# Patient Record
Sex: Male | Born: 1985 | Race: Black or African American | Hispanic: Yes | Marital: Married | Smoking: Former smoker
Health system: Southern US, Community
[De-identification: ages and names within clinical notes are randomized; demographics above are authoritative.]

## PROBLEM LIST (undated history)

## (undated) DIAGNOSIS — F1290 Cannabis use, unspecified, uncomplicated: Secondary | ICD-10-CM

## (undated) DIAGNOSIS — K219 Gastro-esophageal reflux disease without esophagitis: Secondary | ICD-10-CM

## (undated) DIAGNOSIS — A6000 Herpesviral infection of urogenital system, unspecified: Secondary | ICD-10-CM

## (undated) DIAGNOSIS — F17200 Nicotine dependence, unspecified, uncomplicated: Secondary | ICD-10-CM

## (undated) HISTORY — DX: Cannabis use, unspecified, uncomplicated: F12.90

## (undated) HISTORY — DX: Nicotine dependence, unspecified, uncomplicated: F17.200

## (undated) HISTORY — DX: Gastro-esophageal reflux disease without esophagitis: K21.9

## (undated) HISTORY — DX: Herpesviral infection of urogenital system, unspecified: A60.00

---
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
## 2008-09-15 ENCOUNTER — Emergency Department (HOSPITAL_COMMUNITY): Admission: EM | Admit: 2008-09-15 | Discharge: 2008-09-15 | Payer: Self-pay | Admitting: Emergency Medicine

## 2008-10-03 ENCOUNTER — Encounter (INDEPENDENT_AMBULATORY_CARE_PROVIDER_SITE_OTHER): Payer: Self-pay | Admitting: Adult Health

## 2008-10-03 ENCOUNTER — Ambulatory Visit: Payer: Self-pay | Admitting: Family Medicine

## 2008-10-03 LAB — CONVERTED CEMR LAB
ALT: <8 U/L
AST: 13 U/L
Albumin: 4.7 g/dL
Alkaline Phosphatase: 69 U/L
BUN: 11 mg/dL
Basophils Absolute: 0 10*3/uL
Basophils Relative: 0 %
CO2: 25 meq/L
Calcium: 10.4 mg/dL
Chloride: 104 meq/L
Creatinine, Ser: 1.01 mg/dL
Eosinophils Absolute: 0.2 10*3/uL
Eosinophils Relative: 2 %
Glucose, Bld: 80 mg/dL
HCT: 45.1 %
Helicobacter Pylori Antibody-IgG: 1.9 — ABNORMAL HIGH
Hemoglobin: 15.3 g/dL
Lymphocytes Relative: 34 %
Lymphs Abs: 3.1 10*3/uL
MCHC: 33.9 g/dL
MCV: 87.9 fL
Monocytes Absolute: 0.8 10*3/uL
Monocytes Relative: 9 %
Neutro Abs: 5.2 10*3/uL
Neutrophils Relative %: 56 %
Platelets: 282 10*3/uL
Potassium: 4.5 meq/L
RBC: 5.13 M/uL
RDW: 12.3 %
Sodium: 141 meq/L
Total Bilirubin: 0.6 mg/dL
Total Protein: 7.2 g/dL
WBC: 9.3 10*3/uL

## 2008-10-09 ENCOUNTER — Ambulatory Visit: Payer: Self-pay | Admitting: *Deleted

## 2008-11-28 ENCOUNTER — Ambulatory Visit: Payer: Self-pay | Admitting: Family Medicine

## 2008-11-28 ENCOUNTER — Encounter (INDEPENDENT_AMBULATORY_CARE_PROVIDER_SITE_OTHER): Payer: Self-pay | Admitting: Adult Health

## 2008-11-28 LAB — CONVERTED CEMR LAB
Amphetamine Screen, Ur: NEGATIVE
Barbiturate Quant, Ur: NEGATIVE
Benzodiazepines.: NEGATIVE
Cocaine Metabolites: NEGATIVE
Marijuana Metabolite: POSITIVE — AB
Methadone: NEGATIVE
Opiate Screen, Urine: NEGATIVE
Phencyclidine (PCP): NEGATIVE

## 2008-11-30 ENCOUNTER — Ambulatory Visit (HOSPITAL_COMMUNITY): Admission: RE | Admit: 2008-11-30 | Discharge: 2008-11-30 | Payer: Self-pay | Admitting: Internal Medicine

## 2008-12-19 ENCOUNTER — Ambulatory Visit: Payer: Self-pay | Admitting: Internal Medicine

## 2009-02-13 ENCOUNTER — Emergency Department (HOSPITAL_COMMUNITY): Admission: EM | Admit: 2009-02-13 | Discharge: 2009-02-13 | Payer: Self-pay | Admitting: Emergency Medicine

## 2009-02-22 ENCOUNTER — Ambulatory Visit: Payer: Self-pay | Admitting: Internal Medicine

## 2009-04-30 ENCOUNTER — Ambulatory Visit: Payer: Self-pay | Admitting: Family Medicine

## 2009-04-30 ENCOUNTER — Encounter (INDEPENDENT_AMBULATORY_CARE_PROVIDER_SITE_OTHER): Payer: Self-pay | Admitting: Internal Medicine

## 2009-04-30 LAB — CONVERTED CEMR LAB: GC Probe Amp, Urine: NEGATIVE

## 2009-05-03 ENCOUNTER — Ambulatory Visit: Payer: Self-pay | Admitting: Internal Medicine

## 2009-05-13 ENCOUNTER — Ambulatory Visit: Payer: Self-pay | Admitting: Internal Medicine

## 2009-05-13 ENCOUNTER — Encounter (INDEPENDENT_AMBULATORY_CARE_PROVIDER_SITE_OTHER): Payer: Self-pay | Admitting: Adult Health

## 2009-05-13 LAB — CONVERTED CEMR LAB
Albumin: 5.2 g/dL (ref 3.5–5.2)
Alkaline Phosphatase: 79 units/L (ref 39–117)
Basophils Absolute: 0 10*3/uL (ref 0.0–0.1)
CO2: 23 meq/L (ref 19–32)
Calcium: 10.4 mg/dL (ref 8.4–10.5)
Cholesterol: 164 mg/dL (ref 0–200)
Eosinophils Relative: 2 % (ref 0–5)
HCT: 47.7 % (ref 39.0–52.0)
HDL: 56 mg/dL (ref >39)
Lymphocytes Relative: 41 % (ref 12–46)
MCHC: 36.1 g/dL — ABNORMAL HIGH (ref 30.0–36.0)
Neutrophils Relative %: 48 % (ref 43–77)
Platelets: 288 10*3/uL (ref 150–400)
Potassium: 4.1 meq/L (ref 3.5–5.3)
RBC: 5.53 M/uL (ref 4.22–5.81)
RDW: 12.1 % (ref 11.5–15.5)
Sodium: 138 meq/L (ref 135–145)
Total CHOL/HDL Ratio: 2.9
Total Protein: 8 g/dL (ref 6.0–8.3)
Vit D, 25-Hydroxy: 31 ng/mL (ref 30–89)

## 2009-05-22 ENCOUNTER — Ambulatory Visit (HOSPITAL_COMMUNITY): Admission: RE | Admit: 2009-05-22 | Discharge: 2009-05-22 | Payer: Self-pay | Admitting: Internal Medicine

## 2009-07-16 ENCOUNTER — Emergency Department (HOSPITAL_COMMUNITY): Admission: EM | Admit: 2009-07-16 | Discharge: 2009-07-16 | Payer: Self-pay | Admitting: Emergency Medicine

## 2009-07-19 ENCOUNTER — Encounter (INDEPENDENT_AMBULATORY_CARE_PROVIDER_SITE_OTHER): Payer: Self-pay | Admitting: Adult Health

## 2009-07-19 ENCOUNTER — Ambulatory Visit: Payer: Self-pay | Admitting: Family Medicine

## 2009-07-19 LAB — CONVERTED CEMR LAB
Basophils Relative: 0 % (ref 0–1)
Eosinophils Absolute: 0.1 10*3/uL (ref 0.0–0.7)
HCT: 46.2 % (ref 39.0–52.0)
Hemoglobin: 16 g/dL (ref 13.0–17.0)
Lymphocytes Relative: 31 % (ref 12–46)
MCV: 86 fL (ref 78.0–100.0)
Monocytes Absolute: 1 10*3/uL (ref 0.1–1.0)
Monocytes Relative: 10 % (ref 3–12)
Platelets: 299 10*3/uL (ref 150–400)
RDW: 12.2 % (ref 11.5–15.5)
WBC: 10.1 10*3/uL (ref 4.0–10.5)

## 2009-07-24 ENCOUNTER — Ambulatory Visit: Payer: Self-pay | Admitting: Internal Medicine

## 2010-01-02 ENCOUNTER — Emergency Department (HOSPITAL_COMMUNITY)
Admission: EM | Admit: 2010-01-02 | Discharge: 2010-01-02 | Payer: Self-pay | Source: Home / Self Care | Admitting: Emergency Medicine

## 2010-03-17 LAB — POCT I-STAT, CHEM 8
BUN: 20 mg/dL (ref 6–23)
Calcium, Ion: 1.19 mmol/L (ref 1.12–1.32)
Chloride: 105 meq/L (ref 96–112)
Creatinine, Ser: 1.2 mg/dL (ref 0.4–1.5)
Glucose, Bld: 83 mg/dL (ref 70–99)
HCT: 47 % (ref 39.0–52.0)
Hemoglobin: 16 g/dL (ref 13.0–17.0)
Potassium: 3.9 meq/L (ref 3.5–5.1)
Sodium: 140 meq/L (ref 135–145)
TCO2: 28 mmol/L (ref 0–100)

## 2010-03-23 LAB — POCT I-STAT, CHEM 8
HCT: 53 % — ABNORMAL HIGH (ref 39.0–52.0)
Sodium: 140 mEq/L (ref 135–145)

## 2010-06-23 ENCOUNTER — Emergency Department (HOSPITAL_COMMUNITY)
Admission: EM | Admit: 2010-06-23 | Discharge: 2010-06-23 | Disposition: A | Discharge status: Home / Self Care | Payer: Self-pay | Attending: Emergency Medicine | Admitting: Emergency Medicine

## 2010-06-23 DIAGNOSIS — A6000 Herpesviral infection of urogenital system, unspecified: Secondary | ICD-10-CM | POA: Insufficient documentation

## 2010-06-23 LAB — URINALYSIS, ROUTINE W REFLEX MICROSCOPIC
Hgb urine dipstick: NEGATIVE
Nitrite: NEGATIVE
Specific Gravity, Urine: 1.019 (ref 1.005–1.030)
pH: 6 (ref 5.0–8.0)

## 2010-06-23 LAB — URINE MICROSCOPIC-ADD ON

## 2010-06-24 LAB — URINE CULTURE
Culture  Setup Time: 201206182025
Culture: NO GROWTH

## 2010-12-06 ENCOUNTER — Encounter: Payer: Self-pay | Admitting: Emergency Medicine

## 2010-12-06 ENCOUNTER — Emergency Department (HOSPITAL_COMMUNITY)
Admission: EM | Admit: 2010-12-06 | Discharge: 2010-12-06 | Disposition: A | Discharge status: Home / Self Care | Payer: Self-pay | Attending: Emergency Medicine | Admitting: Emergency Medicine

## 2010-12-06 DIAGNOSIS — R0682 Tachypnea, not elsewhere classified: Secondary | ICD-10-CM | POA: Insufficient documentation

## 2010-12-06 DIAGNOSIS — R110 Nausea: Secondary | ICD-10-CM | POA: Insufficient documentation

## 2010-12-06 DIAGNOSIS — G43909 Migraine, unspecified, not intractable, without status migrainosus: Secondary | ICD-10-CM | POA: Insufficient documentation

## 2010-12-06 DIAGNOSIS — R079 Chest pain, unspecified: Secondary | ICD-10-CM | POA: Insufficient documentation

## 2010-12-06 DIAGNOSIS — H53149 Visual discomfort, unspecified: Secondary | ICD-10-CM | POA: Insufficient documentation

## 2010-12-06 DIAGNOSIS — K219 Gastro-esophageal reflux disease without esophagitis: Secondary | ICD-10-CM | POA: Insufficient documentation

## 2010-12-06 MED ORDER — GI COCKTAIL ~~LOC~~
30.0000 mL | Freq: Once | ORAL | Status: AC
  Administered 2010-12-06: 30 mL via ORAL
  Filled 2010-12-06: qty 30

## 2010-12-06 MED ORDER — DIPHENHYDRAMINE HCL 50 MG/ML IJ SOLN
25.0000 mg | Freq: Once | INTRAMUSCULAR | Status: AC
  Administered 2010-12-06: 20:00:00 via INTRAMUSCULAR
  Filled 2010-12-06: qty 1

## 2010-12-06 MED ORDER — METOCLOPRAMIDE HCL 5 MG/ML IJ SOLN
10.0000 mg | Freq: Once | INTRAMUSCULAR | Status: AC
  Administered 2010-12-06: 10 mg via INTRAMUSCULAR
  Filled 2010-12-06: qty 2

## 2010-12-06 NOTE — ED Provider Notes (Signed)
  Physical Exam  BP 132/72  Pulse 80  Temp(Src) 98.6 F (37 C) (Oral)  Resp 20  SpO2 99%  Physical Exam Migraine headache has been medicated and awaiting relief will reeval in 60 minutes  ED Course  Procedures Reports pain relief will DC per Dr. Trudi Ida instructions  MDM Migraine      Arman Filter, NP 12/06/10 0454  Arman Filter, NP 12/06/10 2055

## 2010-12-06 NOTE — ED Provider Notes (Signed)
History     CSN: [id number] Arrival date & time: 12/06/2010  3:43 PM   First MD Initiated Contact with Patient 12/06/10 1829      Chief Complaint  Patient presents with  . Migraine    (Consider location/radiation/quality/duration/timing/severity/associated sxs/prior treatment) Patient is a 25 y.o. male presenting with migraine. The history is provided by the patient.  Migraine This is a new problem. Episode onset: 3 days ago. The problem occurs constantly. The problem has not changed since onset.Associated symptoms include chest pain and headaches. Pertinent negatives include no abdominal pain and no shortness of breath. The symptoms are aggravated by stress (light, noise). The symptoms are relieved by nothing. He has tried acetaminophen and ASA for the symptoms. The treatment provided no relief.    History reviewed. No pertinent past medical history.  History reviewed. No pertinent past surgical history.  History reviewed. No pertinent family history.  History  Substance Use Topics  . Smoking status: Never Smoker   . Smokeless tobacco: Never Used  . Alcohol Use: Yes     Occasional      Review of Systems  Constitutional: Negative for fever.  Respiratory: Negative for shortness of breath.   Cardiovascular: Positive for chest pain.       Burning and fluxing in the chest  Gastrointestinal: Positive for nausea. Negative for vomiting and abdominal pain.  Neurological: Positive for headaches.    Allergies  Review of patient's allergies indicates no known allergies.  Home Medications  No current outpatient prescriptions on file.  BP 132/72  Pulse 80  Temp(Src) 98.6 F (37 C) (Oral)  Resp 20  SpO2 99%  Physical Exam  Nursing note and vitals reviewed. Constitutional: He is oriented to person, place, and time. He appears well-developed and well-nourished. No distress.  HENT:  Head: Normocephalic and atraumatic.  Right Ear: External ear normal.  Left Ear: External  ear normal.  Mouth/Throat: Oropharynx is clear and moist.  Eyes: Conjunctivae and EOM are normal. Pupils are equal, round, and reactive to light. Right eye exhibits no discharge. Left eye exhibits no discharge.       photophobia  Neck: Normal range of motion. Neck supple. No spinous process tenderness present. No rigidity. No Brudzinski's sign and no Kernig's sign noted.  Cardiovascular: Normal rate, regular rhythm, normal heart sounds and intact distal pulses.   No murmur heard. Pulmonary/Chest: Tachypnea noted. No respiratory distress. He has no decreased breath sounds. He has no wheezes. He has no rhonchi. He has no rales.  Abdominal: Soft. He exhibits no distension. There is no tenderness.  Musculoskeletal: Normal range of motion. He exhibits no edema and no tenderness.  Lymphadenopathy:    He has no cervical adenopathy.  Neurological: He is alert and oriented to person, place, and time. He has normal strength. No cranial nerve deficit or sensory deficit. Coordination and gait normal. GCS eye subscore is 4. GCS verbal subscore is 5. GCS motor subscore is 6.  Skin: Skin is warm and dry. No rash noted.  Psychiatric: He has a normal mood and affect. His behavior is normal.    ED Course  Procedures (including critical care time)  Labs Reviewed - No data to display No results found.   No diagnosis found.    MDM   Pt with typical migraine HA without sx suggestive of SAH(sudden onset, worst of life, or deficits), infection, or cavernous vein thrombosis.  Normal neuro exam and vital signs. Will give HA cocktail and on re-eval. Also c/o of  GERD like sx with burning up and down the sternum.  States he has reflux but not taking meds right now.  Will try a GI cocktail.        Gwyneth Sprout, MD 12/06/10 2002

## 2010-12-06 NOTE — ED Notes (Signed)
Pt reports headache x 4 days and chest pain x 3-4 days.

## 2010-12-06 NOTE — ED Provider Notes (Signed)
I performed all above hx and pe.  PA only involved in re-eval and d/c.  Gwyneth Sprout, MD 12/06/10 2146

## 2011-02-03 ENCOUNTER — Emergency Department (INDEPENDENT_AMBULATORY_CARE_PROVIDER_SITE_OTHER)
Admission: EM | Admit: 2011-02-03 | Discharge: 2011-02-03 | Disposition: A | Discharge status: Home / Self Care | Payer: Self-pay | Source: Home / Self Care | Attending: Family Medicine | Admitting: Family Medicine

## 2011-02-03 DIAGNOSIS — R0789 Other chest pain: Secondary | ICD-10-CM

## 2011-02-03 MED ORDER — IBUPROFEN 800 MG PO TABS: 800.0000 mg | ORAL_TABLET | Freq: Three times a day (TID) | ORAL | Status: AC

## 2011-02-03 NOTE — ED Notes (Signed)
C/o chest pain onset one week ago, sharp with palpation

## 2011-02-03 NOTE — ED Notes (Signed)
Denies nausea or vomiting, little sob.  No known injury

## 2011-02-03 NOTE — ED Provider Notes (Signed)
History     CSN: [id number]  Arrival date & time 02/03/11  1448   First MD Initiated Contact with Patient 02/03/11 1512      Chief Complaint  Patient presents with  . Chest Pain    (Consider location/radiation/quality/duration/timing/severity/associated sxs/prior treatment) Patient is a 26 y.o. male presenting with chest pain. The history is provided by the patient and a friend.  Chest Pain The chest pain began 1 - 2 weeks ago. Chest pain occurs intermittently. The chest pain is unchanged. The pain is associated with lifting. The severity of the pain is mild. The quality of the pain is described as sharp. The pain radiates to the left shoulder. Pertinent negatives for primary symptoms include no palpitations. Risk factors include no known risk factors.     No past medical history on file.  No past surgical history on file.  No family history on file.  History  Substance Use Topics  . Smoking status: Never Smoker   . Smokeless tobacco: Never Used  . Alcohol Use: Yes     Occasional      Review of Systems  Constitutional: Negative.   HENT: Negative.   Eyes: Negative.   Respiratory: Negative.   Cardiovascular: Positive for chest pain. Negative for palpitations.  Gastrointestinal: Negative.     Allergies  Review of patient's allergies indicates no known allergies.  Home Medications   Current Outpatient Rx  Name Route Sig Dispense Refill  . IBUPROFEN 800 MG PO TABS Oral Take 1 tablet (800 mg total) by mouth 3 (three) times daily. 30 tablet 0    BP 146/79  Pulse 117  Temp(Src) 99.6 F (37.6 C) (Oral)  Resp 16  SpO2 100%  Physical Exam  Nursing note and vitals reviewed. Constitutional: He is oriented to person, place, and time. He appears well-developed and well-nourished.  HENT:  Head: Normocephalic.  Mouth/Throat: Oropharynx is clear and moist.  Eyes: Pupils are equal, round, and reactive to light.  Neck: Normal range of motion. Neck supple.    Cardiovascular: Normal rate, normal heart sounds and intact distal pulses.   Pulmonary/Chest: Effort normal and breath sounds normal. He exhibits tenderness.  Lymphadenopathy:    He has no cervical adenopathy.  Neurological: He is alert and oriented to person, place, and time.  Skin: Skin is warm and dry.  Psychiatric: He has a normal mood and affect.    ED Course  Procedures (including critical care time)  Labs Reviewed - No data to display No results found.   1. Musculoskeletal chest pain       MDM          Barkley Bruns, MD 02/03/11 [phone]

## 2011-02-07 ENCOUNTER — Encounter (HOSPITAL_COMMUNITY): Payer: Self-pay | Admitting: Emergency Medicine

## 2011-05-23 IMAGING — CR DG KNEE COMPLETE 4+V*R*
4 series · 4 of 4 positions shown · non-contrast
Comparison: None

CLINICAL DATA: Twisting injury to right knee

RIGHT KNEE - COMPLETE 4+ VIEW

[t knee ap right]
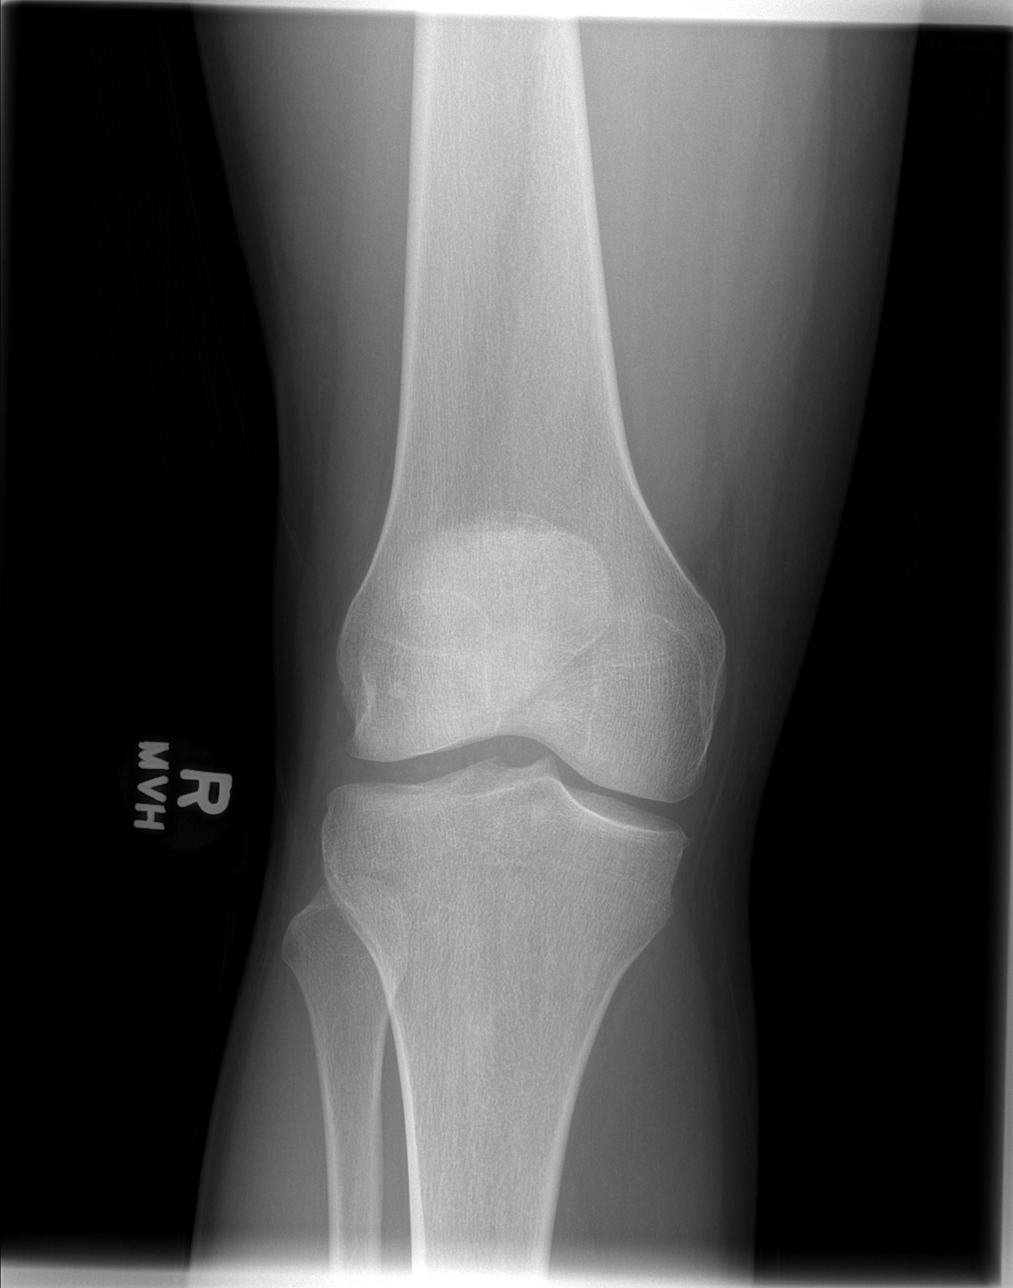

[t knee oblique right (1 of 2)]
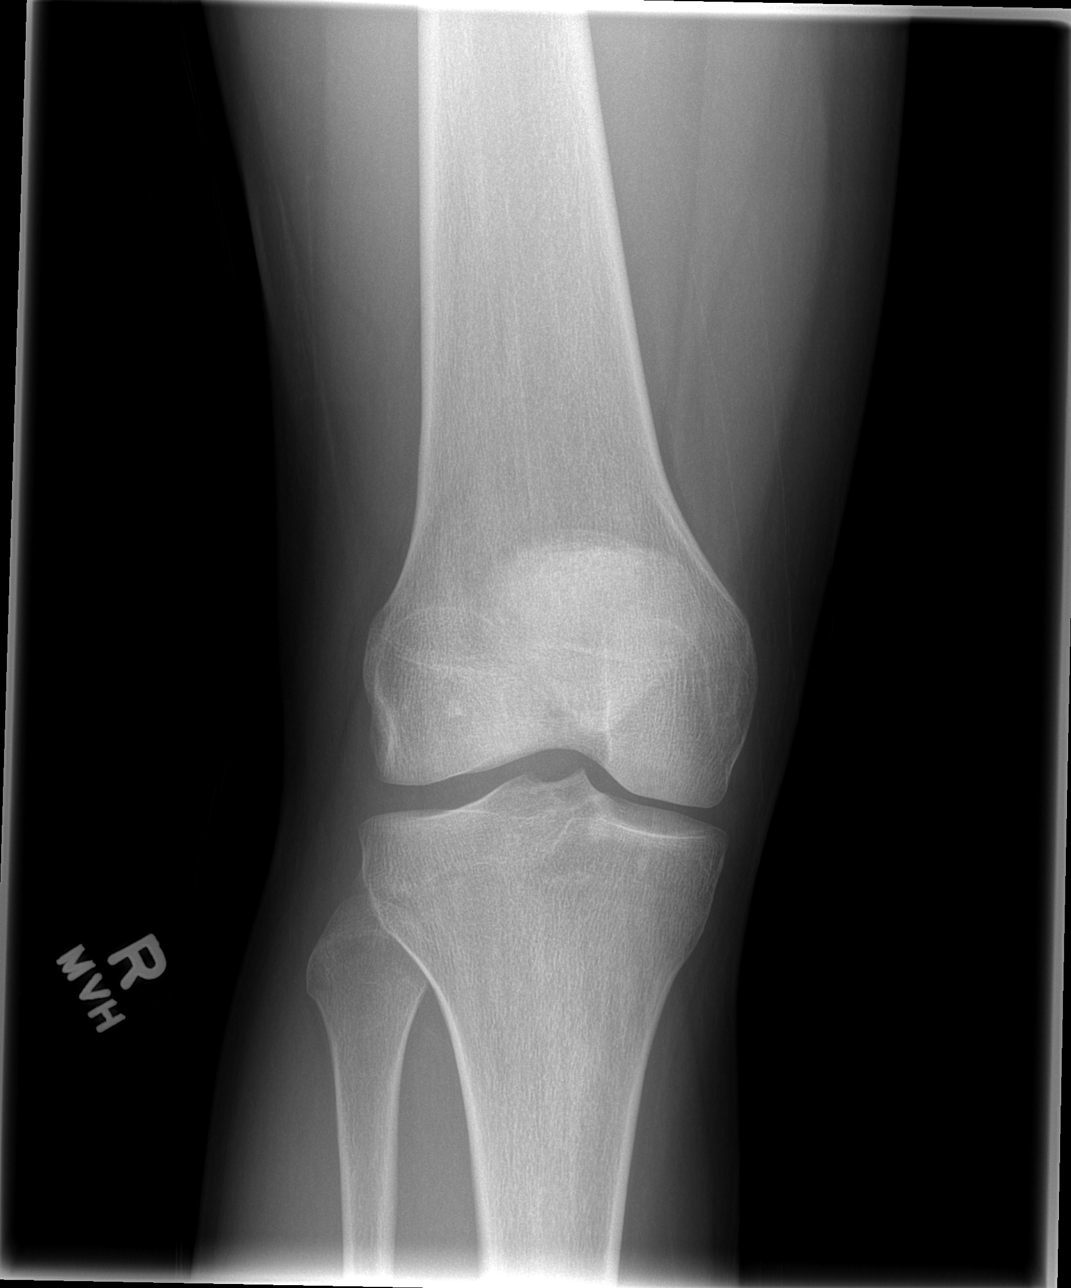

[t knee oblique right (2 of 2)]
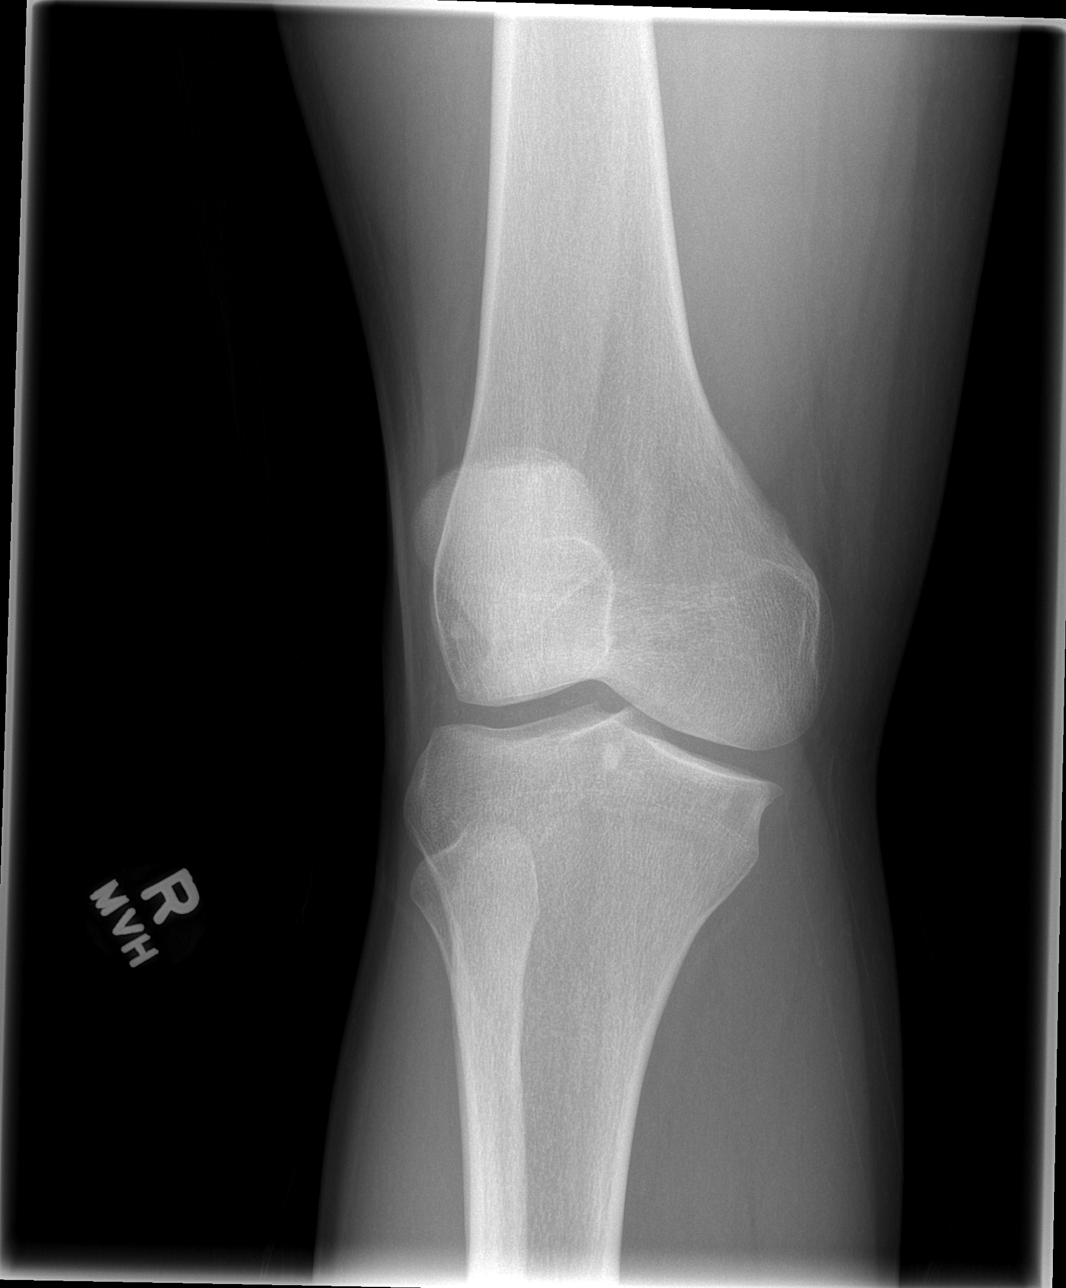

[t knee lat right]
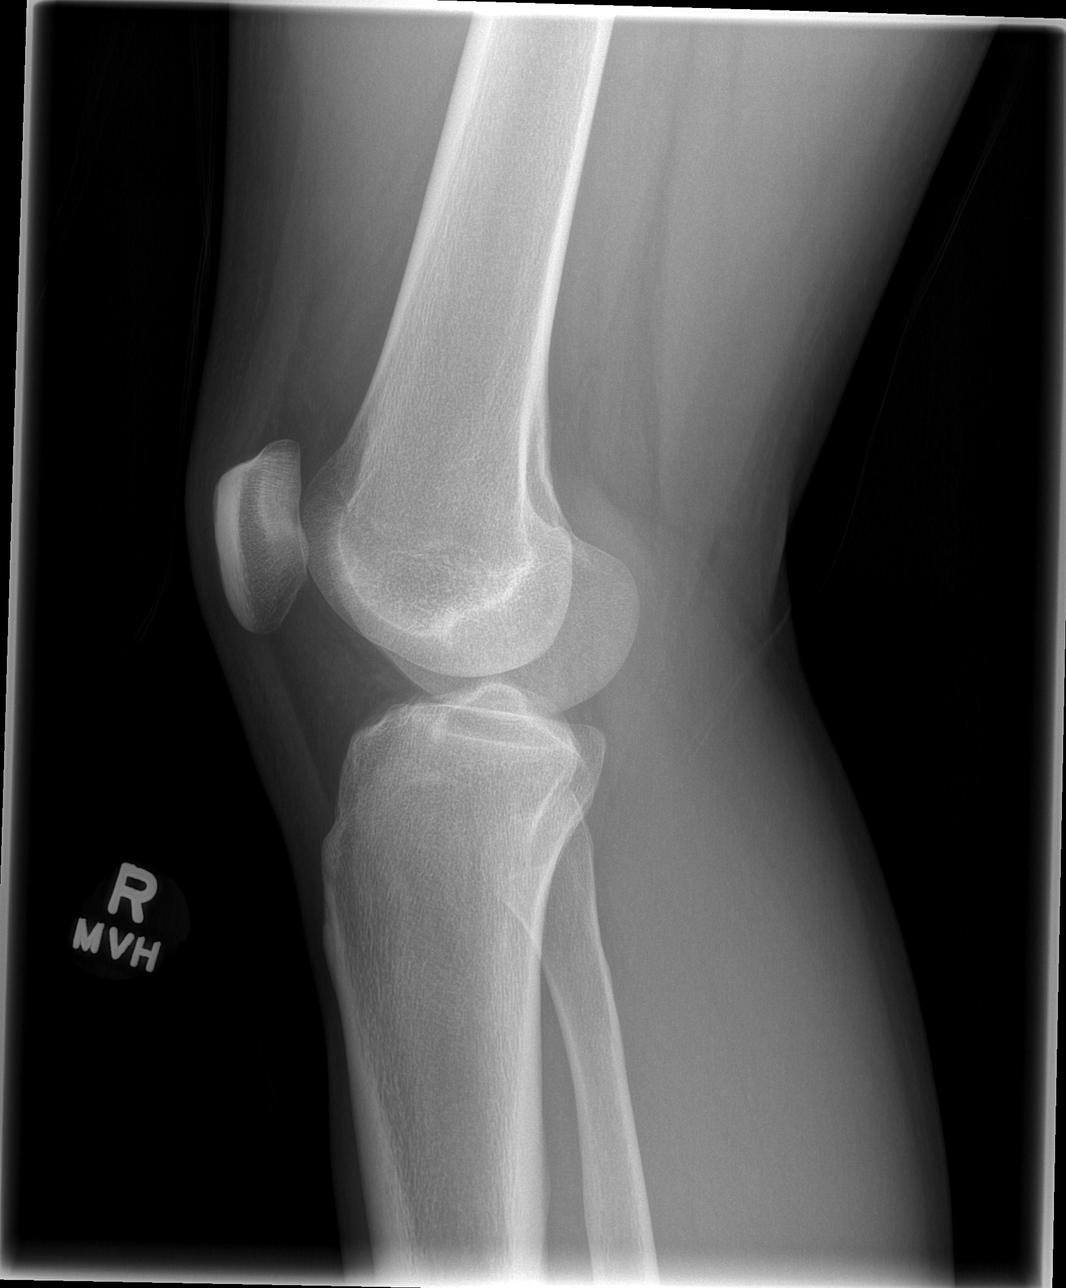

[4 of 4 positions shown; findings below may reference images not displayed]

FINDINGS: There is no evidence of fracture or dislocation.  There
is no evidence of arthropathy or other focal bone abnormality.
Soft tissues are unremarkable.
IMPRESSION: No acute findings.

## 2012-03-16 ENCOUNTER — Encounter (HOSPITAL_COMMUNITY): Payer: Self-pay | Admitting: *Deleted

## 2012-03-16 ENCOUNTER — Emergency Department (HOSPITAL_COMMUNITY)
Admission: EM | Admit: 2012-03-16 | Discharge: 2012-03-16 | Disposition: A | Discharge status: Home / Self Care | Payer: No Typology Code available for payment source | Attending: Emergency Medicine | Admitting: Emergency Medicine

## 2012-03-16 DIAGNOSIS — R51 Headache: Secondary | ICD-10-CM | POA: Insufficient documentation

## 2012-03-16 DIAGNOSIS — Z862 Personal history of diseases of the blood and blood-forming organs and certain disorders involving the immune mechanism: Secondary | ICD-10-CM | POA: Insufficient documentation

## 2012-03-16 DIAGNOSIS — R42 Dizziness and giddiness: Secondary | ICD-10-CM

## 2012-03-16 DIAGNOSIS — M542 Cervicalgia: Secondary | ICD-10-CM | POA: Insufficient documentation

## 2012-03-16 DIAGNOSIS — Z8639 Personal history of other endocrine, nutritional and metabolic disease: Secondary | ICD-10-CM | POA: Insufficient documentation

## 2012-03-16 LAB — CBC WITH DIFFERENTIAL/PLATELET
Basophils Absolute: 0 10*3/uL (ref 0.0–0.1)
Eosinophils Absolute: 0.1 10*3/uL (ref 0.0–0.7)
Lymphocytes Relative: 27 % (ref 12–46)
MCV: 84.1 fL (ref 78.0–100.0)
Platelets: 257 10*3/uL (ref 150–400)
RDW: 11.9 % (ref 11.5–15.5)
WBC: 11.2 10*3/uL — ABNORMAL HIGH (ref 4.0–10.5)

## 2012-03-16 LAB — BASIC METABOLIC PANEL
BUN: 8 mg/dL (ref 6–23)
Chloride: 103 mEq/L (ref 96–112)
GFR calc Af Amer: >90 mL/min (ref >90)
Potassium: 3.9 mEq/L (ref 3.5–5.1)

## 2012-03-16 MED ORDER — MECLIZINE HCL 25 MG PO TABS: 25.0000 mg | ORAL_TABLET | Freq: Three times a day (TID) | ORAL | Status: DC | PRN

## 2012-03-16 MED ORDER — SODIUM CHLORIDE 0.9 % IV BOLUS (SEPSIS)
500.0000 mL | Freq: Once | INTRAVENOUS | Status: AC
  Administered 2012-03-16: 500 mL via INTRAVENOUS

## 2012-03-16 NOTE — ED Provider Notes (Signed)
History     CSN: [id number]  Arrival date & time 03/16/12  1208   First MD Initiated Contact with Patient 03/16/12 1523      Chief Complaint  Patient presents with  . Dizziness    (Consider location/radiation/quality/duration/timing/severity/associated sxs/prior treatment) HPI Comments: Patient comes to the emergency department for evaluation of dizziness. Patient reports that he has had symptoms for 3 or 4 days. Patient reports that he gets very lightheaded and at times when he stands up he feels like he is spinning. If he sits and rest, symptoms get better. He has had persistent recurrent symptoms with changes in position, however. Patient reports that he felt similarly a year ago when he got dehydrated. He has not had any nausea, vomiting or diarrhea. He denies nasal congestion, fever, and neck stiffness.   History reviewed. No pertinent past medical history.  History reviewed. No pertinent past surgical history.  History reviewed. No pertinent family history.  History  Substance Use Topics  . Smoking status: Never Smoker   . Smokeless tobacco: Never Used  . Alcohol Use: Yes     Comment: Occasional      Review of Systems  Constitutional: Negative for fever.  HENT: Positive for neck pain. Negative for hearing loss, ear pain, neck stiffness and tinnitus.   Respiratory: Negative for cough.   Neurological: Positive for dizziness, light-headedness and headaches.  All other systems reviewed and are negative.    Allergies  Review of patient's allergies indicates no known allergies.  Home Medications  No current outpatient prescriptions on file.  BP 165/87  Pulse 75  Temp(Src) 98.1 F (36.7 C)  Resp 18  SpO2 100%  Physical Exam  Constitutional: He is oriented to person, place, and time. He appears well-developed and well-nourished. No distress.  HENT:  Head: Normocephalic and atraumatic.  Right Ear: Hearing, tympanic membrane and ear canal normal.  Left Ear:  Tympanic membrane and ear canal normal.  Nose: Nose normal.  Mouth/Throat: Oropharynx is clear and moist and mucous membranes are normal.  Eyes: Conjunctivae and EOM are normal. Pupils are equal, round, and reactive to light.  Neck: Normal range of motion. Neck supple.  Cardiovascular: Normal rate, regular rhythm, S1 normal and S2 normal.  Exam reveals no gallop and no friction rub.   No murmur heard. Pulmonary/Chest: Effort normal and breath sounds normal. No respiratory distress. He exhibits no tenderness.  Abdominal: Soft. Normal appearance and bowel sounds are normal. There is no hepatosplenomegaly. There is no tenderness. There is no rebound, no guarding, no tenderness at McBurney's point and negative Murphy's sign. No hernia.  Musculoskeletal: Normal range of motion.  Neurological: He is alert and oriented to person, place, and time. He has normal strength. No cranial nerve deficit or sensory deficit. Coordination normal. GCS eye subscore is 4. GCS verbal subscore is 5. GCS motor subscore is 6.  Skin: Skin is warm, dry and intact. No rash noted. No cyanosis.  Psychiatric: He has a normal mood and affect. His speech is normal and behavior is normal. Thought content normal.    ED Course  Procedures (including critical care time)  Labs Reviewed  CBC WITH DIFFERENTIAL - Abnormal; Notable for the following:    WBC 11.2 (*)    MCHC 36.2 (*)    All other components within normal limits  BASIC METABOLIC PANEL   No results found.   Diagnosis: Vertigo    MDM  She presents to the ER with 3 days of intermittent dizziness and  vertiginous symptoms. He has had some mild headache as well. All symptoms come and go. This is not the worse headache of his life or a thunderclap type onset. Patient has a normal neurologic examination. No suspicion for subarachnoid hemorrhage. He has not had any trauma. Description of symptoms is most consistent with a peripheral vertigo.Medical screening  examination/treatment/procedure(s) were performed by non-physician practitioner and as supervising physician I was immediately available for consultation/collaboration. Work unremarkable. Patient administered IV hydration will be treated symptomatically. Return if symptoms worsen.        Gilda Crease, MD 03/16/12 1731

## 2012-03-16 NOTE — ED Notes (Signed)
Reports being lightheaded, dizzy and weak x 3-4 days.  States that he has also had a HA.  Pt denies taking medication PTA, denies N/V/D.  PERRLA.  States that he smoked marijuana 2 days ago.  Pt A/O x 4, ambulatory without difficulty.

## 2012-04-07 ENCOUNTER — Emergency Department (INDEPENDENT_AMBULATORY_CARE_PROVIDER_SITE_OTHER)
Admission: EM | Admit: 2012-04-07 | Discharge: 2012-04-07 | Disposition: A | Discharge status: Home / Self Care | Payer: No Typology Code available for payment source | Source: Home / Self Care

## 2012-04-07 ENCOUNTER — Encounter (HOSPITAL_COMMUNITY): Payer: Self-pay

## 2012-04-07 DIAGNOSIS — K029 Dental caries, unspecified: Secondary | ICD-10-CM

## 2012-04-07 NOTE — ED Notes (Signed)
Patient here for dental referral

## 2012-04-07 NOTE — ED Notes (Signed)
Patient Demographics  Randall Rodriguez, is a 27 y.o. male  OZH:[id number]  GEX:[id number]  DOB - 1985/02/26  Chief Complaint  Patient presents with  . Dental Pain        Subjective:   Randall Rodriguez no medical issues is here to get a dentist referral for her dental cavity. No pain tenderness at the site, no fever chills or discharge, no jaw swelling.  Objective:   History reviewed. No pertinent past medical history.    History reviewed. No pertinent past surgical history.   Filed Vitals:   04/07/12 1310  BP: 130/64  Pulse: 68  Temp: 98 F (36.7 C)  TempSrc: Oral  SpO2: 99%     Exam  Awake Alert, Oriented X 3, No new F.N deficits, Normal affect [place].AT,PERRAL, Mall cavity noted in the left lower mandibular tooth no signs of returning infection or abscess no tenderness. Supple Neck,No JVD, No cervical lymphadenopathy appriciated.  Symmetrical Chest wall movement, Good air movement bilaterally, CTAB RRR,No Gallops,Rubs or new Murmurs, No Parasternal Heave +ve B.Sounds, Abd Soft, Non tender, No organomegaly appriciated, No rebound - guarding or rigidity. No Cyanosis, Clubbing or edema, No new Rash or bruise      Data Review   CBC No results found for this basename: WBC, HGB, HCT, PLT, MCV, MCH, MCHC, RDW, NEUTRABS, LYMPHSABS, MONOABS, EOSABS, BASOSABS, BANDABS, BANDSABD,  in the last 168 hours  Chemistries   No results found for this basename: NA, K, CL, CO2, GLUCOSE, BUN, CREATININE, GFRCGP, CALCIUM, MG, AST, ALT, ALKPHOS, BILITOT,  in the last 168 hours ------------------------------------------------------------------------------------------------------------------ No results found for this basename: HGBA1C,  in the last 72 hours ------------------------------------------------------------------------------------------------------------------ No results found for this basename: CHOL, HDL, LDLCALC, TRIG, CHOLHDL, LDLDIRECT,  in the last 72  hours ------------------------------------------------------------------------------------------------------------------ No results found for this basename: TSH, T4TOTAL, FREET3, T3FREE, THYROIDAB,  in the last 72 hours ------------------------------------------------------------------------------------------------------------------ No results found for this basename: VITAMINB12, FOLATE, FERRITIN, TIBC, IRON, RETICCTPCT,  in the last 72 hours  Coagulation profile  No results found for this basename: INR, PROTIME,  in the last 168 hours     Prior to Admission medications   Medication Sig Start Date End Date Taking? Authorizing Provider  meclizine (ANTIVERT) 25 MG tablet Take 1 tablet (25 mg total) by mouth 3 (three) times daily as needed. 03/16/12   Gilda Crease, MD     Assessment & Plan   Patient here for left mandibular last molar cavity. He wanted to get a referral to a dentist which was provided, does not have any pain swelling tenderness, no fever or chills. He does smoke and has been counseled to quit smoking.    Follow-up Information   Schedule an appointment as soon as possible for a visit to follow up. (As needed)        Leroy Sea M.D on 04/07/2012 at 1:19 PM   Leroy Sea, MD 04/07/12 1323

## 2012-04-11 NOTE — ED Notes (Signed)
Referral faxed to [facility]-waiting for an appt 

## 2012-07-20 ENCOUNTER — Ambulatory Visit: Payer: No Typology Code available for payment source

## 2012-07-27 ENCOUNTER — Ambulatory Visit: Payer: No Typology Code available for payment source | Attending: Family Medicine | Admitting: Family Medicine

## 2012-07-27 VITALS — BP 118/80 | HR 66 | Temp 98.4°F | Resp 14 | Ht 69.5 in | Wt 223.0 lb

## 2012-07-27 DIAGNOSIS — R071 Chest pain on breathing: Secondary | ICD-10-CM

## 2012-07-27 DIAGNOSIS — G8929 Other chronic pain: Secondary | ICD-10-CM | POA: Insufficient documentation

## 2012-07-27 DIAGNOSIS — F1290 Cannabis use, unspecified, uncomplicated: Secondary | ICD-10-CM

## 2012-07-27 DIAGNOSIS — A6000 Herpesviral infection of urogenital system, unspecified: Secondary | ICD-10-CM | POA: Insufficient documentation

## 2012-07-27 DIAGNOSIS — F1210 Cannabis abuse, uncomplicated: Secondary | ICD-10-CM | POA: Insufficient documentation

## 2012-07-27 DIAGNOSIS — K219 Gastro-esophageal reflux disease without esophagitis: Secondary | ICD-10-CM | POA: Insufficient documentation

## 2012-07-27 DIAGNOSIS — Z0000 Encounter for general adult medical examination without abnormal findings: Secondary | ICD-10-CM

## 2012-07-27 DIAGNOSIS — R0789 Other chest pain: Secondary | ICD-10-CM | POA: Insufficient documentation

## 2012-07-27 MED ORDER — ESOMEPRAZOLE MAGNESIUM 40 MG PO CPDR: 40.0000 mg | DELAYED_RELEASE_CAPSULE | Freq: Every day | ORAL | Status: DC

## 2012-07-27 NOTE — Patient Instructions (Signed)
Heartburn Heartburn is a painful, burning sensation in the chest. It may feel worse in certain positions, such as lying down or bending over. It is caused by stomach acid backing up into the tube that carries food from the mouth down to the stomach (lower esophagus).  CAUSES   Large meals.  Certain foods and drinks.  Exercise.  Increased acid production.  Being overweight or obese.  Certain medicines. SYMPTOMS   Burning pain in the chest or lower throat.  Bitter taste in the mouth.  Coughing. DIAGNOSIS  If the usual treatments for heartburn do not improve your symptoms, then tests may be done to see if there is another condition present. Possible tests may include:  X-rays.  Endoscopy. This is when a tube with a light and a camera on the end is used to examine the esophagus and the stomach.  A test to measure the amount of acid in the esophagus (pH test).  A test to see if the esophagus is working properly (esophageal manometry).  Blood, breath, or stool tests to check for bacteria that cause ulcers. TREATMENT   Your caregiver may tell you to use certain over-the-counter medicines (antacids, acid reducers) for mild heartburn.  Your caregiver may prescribe medicines to decrease the acid in your stomach or protect your stomach lining.  Your caregiver may recommend certain diet changes.  For severe cases, your caregiver may recommend that the head of your bed be elevated on blocks. (Sleeping with more pillows is not an effective treatment as it only changes the position of your head and does not improve the main problem of stomach acid refluxing into the esophagus.) HOME CARE INSTRUCTIONS   Take all medicines as directed by your caregiver.  Raise the head of your bed by putting blocks under the legs if instructed to by your caregiver.  Do not exercise right after eating.  Avoid eating 2 or 3 hours before bed. Do not lie down right after eating.  Eat small meals  throughout the day instead of 3 large meals.  Stop smoking if you smoke.  Maintain a healthy weight.  Identify foods and beverages that make your symptoms worse and avoid them. Foods you may want to avoid include:  Peppers.  Chocolate.  High-fat foods, including fried foods.  Spicy foods.  Garlic and onions.  Citrus fruits, including oranges, grapefruit, lemons, and limes.  Food containing tomatoes or tomato products.  Mint.  Carbonated drinks, caffeinated drinks, and alcohol.  Vinegar. SEEK IMMEDIATE MEDICAL CARE IF:  You have severe chest pain that goes down your arm or into your jaw or neck.  You feel sweaty, dizzy, or lightheaded.  You are short of breath.  You vomit blood.  You have difficulty or pain with swallowing.  You have bloody or black, tarry stools.  You have episodes of heartburn more than 3 times a week for more than 2 weeks. MAKE SURE YOU:  Understand these instructions.  Will watch your condition.  Will get help right away if you are not doing well or get worse. Document Released: 05/10/2008 Document Revised: 03/16/2011 Document Reviewed: 06/08/2010 [employer] Patient Information 2014 [employer]. Diet for Gastroesophageal Reflux Disease, Adult Reflux (acid reflux) is when acid from your stomach flows up into the esophagus. When acid comes in contact with the esophagus, the acid causes irritation and soreness (inflammation) in the esophagus. When reflux happens often or so severely that it causes damage to the esophagus, it is called gastroesophageal reflux disease (GERD). Nutrition   therapy can help ease the discomfort of GERD. FOODS OR DRINKS TO AVOID OR LIMIT  Smoking or chewing tobacco. Nicotine is one of the most potent stimulants to acid production in the gastrointestinal tract.  Caffeinated and decaffeinated coffee and black tea.  Regular or low-calorie carbonated beverages or energy drinks (caffeine-free carbonated beverages are  allowed).   Strong spices, such as black pepper, white pepper, red pepper, cayenne, curry powder, and chili powder.  Peppermint or spearmint.  Chocolate.  High-fat foods, including meats and fried foods. Extra added fats including oils, butter, salad dressings, and nuts. Limit these to less than 8 tsp per day.  Fruits and vegetables if they are not tolerated, such as citrus fruits or tomatoes.  Alcohol.  Any food that seems to aggravate your condition. If you have questions regarding your diet, call your caregiver or a registered dietitian. OTHER THINGS THAT MAY HELP GERD INCLUDE:   Eating your meals slowly, in a relaxed setting.  Eating 5 to 6 small meals per day instead of 3 large meals.  Eliminating food for a period of time if it causes distress.  Not lying down until 3 hours after eating a meal.  Keeping the head of your bed raised 6 to 9 inches (15 to 23 cm) by using a foam wedge or blocks under the legs of the bed. Lying flat may make symptoms worse.  Being physically active. Weight loss may be helpful in reducing reflux in overweight or obese adults.  Wear loose fitting clothing EXAMPLE MEAL PLAN This meal plan is approximately 2,000 calories based on https://www.[place].org/ChooseMyPlate.gov meal planning guidelines. Breakfast   cup cooked oatmeal.  1 cup strawberries.  1 cup low-fat milk.  1 oz almonds. Snack  1 cup cucumber slices.  6 oz yogurt (made from low-fat or fat-free milk). Lunch  2 slice whole-wheat bread.  2 oz sliced [place].  2 tsp mayonnaise.  1 cup blueberries.  1 cup snap peas. Snack  6 whole-wheat crackers.  1 oz string cheese. Dinner   cup brown rice.  1 cup mixed veggies.  1 tsp olive oil.  3 oz grilled fish. Document Released: 12/22/2004 Document Revised: 03/16/2011 Document Reviewed: 11/07/2010 [facility] Patient Information 2014 [place], [place].

## 2012-07-27 NOTE — Progress Notes (Signed)
Patient states he is here for annual physical.

## 2012-07-27 NOTE — Progress Notes (Signed)
CC:  New patient physical   HPI: Pt is presenting to have a physical done today.  He says that he has a problem with left side pain that has been present for  Years.  He has had xrays done and reports that it persists.  It is 8/10 and reports that he has untreated GERD.  Pt says that he has genital herpes and reports that he takes Valtrex as needed for outbreaks.  Pt denies nausea and vomiting.  He reports that certain foods don't seem to cause more pain.  He says that coughing and taking a deep breath does not cause more pain.    No Known Allergies Past Medical History  Diagnosis Date  . GERD (gastroesophageal reflux disease)   . Smoker   . Genital herpes   . Marijuana smoker    Current Outpatient Prescriptions on File Prior to Visit  Medication Sig Dispense Refill  . meclizine (ANTIVERT) 25 MG tablet Take 1 tablet (25 mg total) by mouth 3 (three) times daily as needed.  30 tablet  0   No current facility-administered medications on file prior to visit.   Family History  Problem Relation Age of Onset  . Hypertension Father   . Diabetes Father    History   Social History  . Marital Status: Single    Spouse Name: N/A    Number of Children: 0  . Years of Education: 11    Occupational History  . construction [employer]   Social History Main Topics  . Smoking status: Former Games developer  . Smokeless tobacco: Never Used  . Alcohol Use: Yes     Comment: Occasional  . Drug Use: Yes    Special: Marijuana  . Sexually Active: Yes   Other Topics Concern  . Not on file   Social History Narrative  . No narrative on file    Review of Systems  Constitutional: Negative for fever, chills, diaphoresis, activity change, appetite change and fatigue.  HENT: Negative for ear pain, nosebleeds, congestion, facial swelling, rhinorrhea, neck pain, neck stiffness and ear discharge.   Eyes: Negative for pain, discharge, redness, itching and visual disturbance.  Respiratory: Negative for cough,  choking, chest tightness, shortness of breath, wheezing and stridor.   Cardiovascular: left sided chest wall pain, Negative for palpitations and leg swelling.  Gastrointestinal: Negative for abdominal distention.  Genitourinary: Negative for dysuria, urgency, frequency, hematuria, flank pain, decreased urine volume, difficulty urinating and dyspareunia.  Musculoskeletal: left side pain and discomfort for several years. Negative for back pain, joint swelling, arthralgias and gait problem.  Neurological: Negative for dizziness, tremors, seizures, syncope, facial asymmetry, speech difficulty, weakness, light-headedness, numbness and headaches.  Hematological: Negative for adenopathy. Does not bruise/bleed easily.  Psychiatric/Behavioral: Negative for hallucinations, behavioral problems, confusion, dysphoric mood, decreased concentration and agitation.    Objective:   Filed Vitals:   07/27/12 0942  BP: 118/80  Pulse: 66  Temp: 98.4 F (36.9 C)  Resp: 14    Physical Exam  Constitutional: Appears well-developed and well-nourished. No distress.  HENT: Normocephalic. External right and left ear normal. Oropharynx is clear and moist.  Eyes: Conjunctivae and EOM are normal. PERRLA, no scleral icterus.  Neck: Normal ROM. Neck supple. No JVD. No tracheal deviation. No thyromegaly.  CVS: RRR, S1/S2 +, no murmurs, no gallops, no carotid bruit.  Pulmonary: Effort and breath sounds normal, no stridor, rhonchi, wheezes, rales.  Abdominal: Soft. BS +,  no distension, tenderness, rebound or guarding.  Mild epigastric TTP. Chest:  left sided chest wall pain not reproduced with palpation of chest wall, no masses seen.  Musculoskeletal: Normal range of motion. No edema and no tenderness.  Lymphadenopathy: No lymphadenopathy noted, cervical, inguinal. Neuro: Alert. Normal reflexes, muscle tone coordination. No cranial nerve deficit. Skin: Skin is warm and dry. No rash noted. Not diaphoretic. No erythema. No  pallor.  Psychiatric: Normal mood and affect. Behavior, judgment, thought content normal.   Lab Results  Component Value Date   WBC 11.2* 03/16/2012   HGB 16.1 03/16/2012   HCT 44.5 03/16/2012   MCV 84.1 03/16/2012   PLT 257 03/16/2012   Lab Results  Component Value Date   CREATININE 1.12 03/16/2012   BUN 8 03/16/2012   NA 140 03/16/2012   K 3.9 03/16/2012   CL 103 03/16/2012   CO2 26 03/16/2012    Lab Results  Component Value Date   HGBA1C 5.3 05/13/2009   Lipid Panel     Component Value Date/Time   CHOL 164 05/13/2009 2301   TRIG 62 05/13/2009 2301   HDL 56 05/13/2009 2301   CHOLHDL 2.9 Ratio 05/13/2009 2301   VLDL 12 05/13/2009 2301   LDLCALC 96 05/13/2009 2301       Assessment and plan:   Genital Herpes - pt to use valtrex as needed for outbreaks   Marijuana Smoker - pt is encouraged in cessation of using recreational substances. The patient verbalized understanding.    GERD untreated - Start omeprazole 20 mg  Po daily  Chronic left side chest wall pain  Order for CT chest to evaluate pain  Check labs today  Refill Valtrex.  RTC in 3 months  The patient was given clear instructions to go to ER or return to medical center if symptoms don't improve, worsen or new problems develop.  The patient verbalized understanding.  The patient was told to call to get any lab results if not heard anything in the next week.    Rodney Langton, MD, CDE, FAAFP [facility] [place], [place]

## 2012-07-28 LAB — COMPLETE METABOLIC PANEL WITH GFR
Alkaline Phosphatase: 78 U/L (ref 39–117)
BUN: 10 mg/dL (ref 6–23)
Creat: 1.04 mg/dL (ref 0.50–1.35)
GFR, Est Non African American: >89 mL/min
Glucose, Bld: 89 mg/dL (ref 70–99)
Total Bilirubin: 0.6 mg/dL (ref 0.3–1.2)

## 2012-07-28 LAB — LIPID PANEL
Cholesterol: 185 mg/dL (ref 0–200)
HDL: 52 mg/dL (ref >39)
LDL Cholesterol: 119 mg/dL — ABNORMAL HIGH (ref 0–99)
Total CHOL/HDL Ratio: 3.6 Ratio
Triglycerides: 72 mg/dL (ref <150)
VLDL: 14 mg/dL (ref 0–40)

## 2012-07-28 LAB — CBC
HCT: 42.9 % (ref 39.0–52.0)
MCV: 86 fL (ref 78.0–100.0)
Platelets: 307 10*3/uL (ref 150–400)
RBC: 4.99 MIL/uL (ref 4.22–5.81)
WBC: 7.7 10*3/uL (ref 4.0–10.5)

## 2012-07-28 NOTE — Progress Notes (Signed)
Quick Note:  Please inform patient that all labs came back okay.  Rodney Langton, MD, CDE, FAAFP [facility] [place], [place]   ______

## 2012-07-29 ENCOUNTER — Ambulatory Visit (HOSPITAL_COMMUNITY)
Admission: RE | Admit: 2012-07-29 | Discharge: 2012-07-29 | Disposition: A | Discharge status: Home / Self Care | Payer: No Typology Code available for payment source | Source: Ambulatory Visit | Attending: Family Medicine | Admitting: Family Medicine

## 2012-07-29 ENCOUNTER — Telehealth: Payer: Self-pay | Admitting: Family Medicine

## 2012-07-29 DIAGNOSIS — R071 Chest pain on breathing: Secondary | ICD-10-CM | POA: Insufficient documentation

## 2012-07-29 DIAGNOSIS — F17200 Nicotine dependence, unspecified, uncomplicated: Secondary | ICD-10-CM | POA: Insufficient documentation

## 2012-07-29 DIAGNOSIS — K219 Gastro-esophageal reflux disease without esophagitis: Secondary | ICD-10-CM | POA: Insufficient documentation

## 2012-07-29 DIAGNOSIS — R0789 Other chest pain: Secondary | ICD-10-CM

## 2012-07-29 MED ORDER — IOHEXOL 300 MG/ML  SOLN
80.0000 mL | Freq: Once | INTRAMUSCULAR | Status: AC | PRN
  Administered 2012-07-29: 80 mL via INTRAVENOUS

## 2012-07-29 NOTE — Progress Notes (Signed)
Quick Note:  Please inform patient that CT chest results were : Normal chest CT. No explanation for left-sided chest pain. Please follow up in next 3 months as scheduled or return sooner if symptoms not better.   Rodney Langton, MD, CDE, FAAFP [facility] [place], [place]    ______

## 2012-08-01 ENCOUNTER — Telehealth: Payer: Self-pay | Admitting: *Deleted

## 2012-08-01 NOTE — Telephone Encounter (Signed)
08/01/12 Patient made aware of CT chest results normal .Please follow up in 3 months. P.Posey Jasmin,RN BSN [facility]

## 2012-10-27 ENCOUNTER — Ambulatory Visit: Payer: Self-pay

## 2012-11-03 ENCOUNTER — Encounter (HOSPITAL_COMMUNITY): Payer: Self-pay | Admitting: Emergency Medicine

## 2012-11-03 ENCOUNTER — Emergency Department (HOSPITAL_COMMUNITY)
Admission: EM | Admit: 2012-11-03 | Discharge: 2012-11-03 | Disposition: A | Discharge status: Home / Self Care | Payer: No Typology Code available for payment source | Attending: Emergency Medicine | Admitting: Emergency Medicine

## 2012-11-03 DIAGNOSIS — Z8619 Personal history of other infectious and parasitic diseases: Secondary | ICD-10-CM | POA: Insufficient documentation

## 2012-11-03 DIAGNOSIS — K029 Dental caries, unspecified: Secondary | ICD-10-CM | POA: Insufficient documentation

## 2012-11-03 DIAGNOSIS — Z792 Long term (current) use of antibiotics: Secondary | ICD-10-CM | POA: Insufficient documentation

## 2012-11-03 DIAGNOSIS — Z8719 Personal history of other diseases of the digestive system: Secondary | ICD-10-CM | POA: Insufficient documentation

## 2012-11-03 DIAGNOSIS — K089 Disorder of teeth and supporting structures, unspecified: Secondary | ICD-10-CM | POA: Insufficient documentation

## 2012-11-03 DIAGNOSIS — K0889 Other specified disorders of teeth and supporting structures: Secondary | ICD-10-CM

## 2012-11-03 DIAGNOSIS — Z87891 Personal history of nicotine dependence: Secondary | ICD-10-CM | POA: Insufficient documentation

## 2012-11-03 MED ORDER — PENICILLIN V POTASSIUM 500 MG PO TABS: 500.0000 mg | ORAL_TABLET | Freq: Three times a day (TID) | ORAL | Status: DC

## 2012-11-03 MED ORDER — HYDROCODONE-ACETAMINOPHEN 5-325 MG PO TABS
1.0000 | ORAL_TABLET | Freq: Once | ORAL | Status: AC
  Administered 2012-11-03: 1 via ORAL
  Filled 2012-11-03: qty 1

## 2012-11-03 MED ORDER — HYDROCODONE-ACETAMINOPHEN 5-325 MG PO TABS: 1.0000 | ORAL_TABLET | ORAL | Status: DC | PRN

## 2012-11-03 NOTE — ED Notes (Signed)
Pt presents with onset of pain to R upper tooth x 2 days.  Pt reports having root canal to same tooth x 5 days ago.

## 2012-11-06 NOTE — ED Provider Notes (Signed)
CSN: [id number]     Arrival date & time 11/03/12  1302 History   First MD Initiated Contact with Patient 11/03/12 1423     No chief complaint on file.  (Consider location/radiation/quality/duration/timing/severity/associated sxs/prior Treatment) HPI Comments: The patient is a 27 year old male with a history of several cavities and root canals presenting to the ED with tooth pain for 2 days.  He reports having a root canal to the tooth when he was 19. He reports the pain was gradual on set and is worsening is aggravated by eating.  Denies fever or chills, discharge from gums, swelling of gums, swollen glands, headache, sore throat, rhinorrhea, or ear pain.    Past Medical History  Diagnosis Date  . GERD (gastroesophageal reflux disease)   . Smoker   . Genital herpes   . Marijuana smoker    History reviewed. No pertinent past surgical history. Family History  Problem Relation Age of Onset  . Hypertension Father   . Diabetes Father    History  Substance Use Topics  . Smoking status: Former Games developer  . Smokeless tobacco: Never Used  . Alcohol Use: Yes     Comment: Occasional    Review of Systems  All other systems reviewed and are negative.    Allergies  Review of patient's allergies indicates no known allergies.  Home Medications   Current Outpatient Rx  Name  Route  Sig  Dispense  Refill  . ibuprofen (ADVIL,MOTRIN) 200 MG tablet   Oral   Take 400 mg by mouth every 6 (six) hours as needed for pain.         Marland Kitchen HYDROcodone-acetaminophen (NORCO/VICODIN) 5-325 MG per tablet   Oral   Take 1 tablet by mouth every 4 (four) hours as needed for pain.   10 tablet   0   . penicillin v potassium (VEETID) 500 MG tablet   Oral   Take 1 tablet (500 mg total) by mouth 3 (three) times daily.   30 tablet   0    BP 136/68  Pulse 68  Temp(Src) 98.7 F (37.1 C) (Oral)  Resp 18  SpO2 98% Physical Exam  Nursing note and vitals reviewed. Constitutional: He is oriented to  person, place, and time. He appears well-developed and well-nourished. No distress.  HENT:  Right Ear: Tympanic membrane normal. No drainage or swelling. Tympanic membrane is not bulging.  Left Ear: Tympanic membrane normal. No drainage or swelling. Tympanic membrane is not bulging.  Nose: No rhinorrhea.  Mouth/Throat: Uvula is midline. Mucous membranes are not dry. No oral lesions. No trismus in the jaw. Dental caries present. No dental abscesses or uvula swelling. No oropharyngeal exudate, posterior oropharyngeal edema or posterior oropharyngeal erythema.  Right First molar with a intact cap. Tenderness to palpation to the lingual surface of gum superior to 1st molar.    Neck: Neck supple.  Cardiovascular: Normal rate.   No murmur heard. Pulmonary/Chest: Effort normal. No respiratory distress. He has no wheezes.  Abdominal: Soft. He exhibits no distension. There is no tenderness. There is no rebound.  Musculoskeletal: He exhibits no edema.  Lymphadenopathy:       Head (right side): No submental, no submandibular, no tonsillar, no preauricular, no posterior auricular and no occipital adenopathy present.       Head (left side): No submental, no submandibular, no tonsillar, no preauricular, no posterior auricular and no occipital adenopathy present.    He has no cervical adenopathy.  Neurological: He is alert and oriented  to person, place, and time.    ED Course  Procedures (including critical care time) Labs Review Labs Reviewed - No data to display Imaging Review No results found.  EKG Interpretation   None       MDM   1. Tooth pain    The patient with poor oral hygine presents with tooth pain, no obvious signs of infection, given tender to palpation on exam will discharge him home with antibiotics and pain medication.  Instructed patient to keep appointment with dentist.   Meds given in ED:  Medications  HYDROcodone-acetaminophen (NORCO/VICODIN) 5-325 MG per tablet 1  tablet (1 tablet Oral Given 11/03/12 1458)    Discharge Medication List as of 11/03/2012  2:49 PM    START taking these medications   Details  HYDROcodone-acetaminophen (NORCO/VICODIN) 5-325 MG per tablet Take 1 tablet by mouth every 4 (four) hours as needed for pain., Starting 11/03/2012, Until Discontinued, Print    penicillin v potassium (VEETID) 500 MG tablet Take 1 tablet (500 mg total) by mouth 3 (three) times daily., Starting 11/03/2012, Until Discontinued, Print            Clabe Seal, PA-C 11/06/12 [phone]

## 2012-11-06 NOTE — ED Provider Notes (Signed)
Medical screening examination/treatment/procedure(s) were performed by non-physician practitioner and as supervising physician I was immediately available for consultation/collaboration.  Demetrias Goodbar M Mallori Araque, MD 11/06/12 1846 

## 2012-11-17 ENCOUNTER — Ambulatory Visit: Payer: No Typology Code available for payment source | Attending: Internal Medicine | Admitting: Internal Medicine

## 2012-11-17 NOTE — Patient Instructions (Signed)
Mildly elevated cholesterol, heart healthy diet, 30 minutes of aerobic exercise 5 to 6 times per week.  Repeat fasting lipid panel in one year.

## 2012-11-17 NOTE — Progress Notes (Unsigned)
Patient here for follow up Has history of genital herpes Has medication for when he has outbreaks

## 2012-11-17 NOTE — Progress Notes (Unsigned)
Patient ID: Randall Rodriguez, male   DOB: 1985-05-07, 27 y.o.   MRN: [id number]   Patient Demographics  Randall Rodriguez, is a 27 y.o. male  [id number]:[id number]  [id number]  DOB - 08/07/1985  Chief Complaint  Patient presents with  . Follow-up        Subjective:   [name] with History of GERD, genital herpes is here for routine followup and to get the results of his blood work checked last time. He is currently no subjective complaints. Smokes marijuana intermittently.  Denies any subjective complaints except as above, no active headache, no chest abdominal pain at this time, not short of breath. No focal weakness which is new.   Objective:    Patient Active Problem List   Diagnosis Date Noted  . GERD (gastroesophageal reflux disease) 07/27/2012  . Left-sided chest wall pain 07/27/2012  . Genital herpes 07/27/2012  . Marijuana smoker 07/27/2012  . Healthcare maintenance 07/27/2012     Filed Vitals:   11/17/12 1048  BP: 139/82  Pulse: 86  Temp: 98.6 F (37 C)  Resp: 16  Height: 5\' 9"  (1.753 m)  Weight: 228 lb 9.6 oz (103.692 kg)  SpO2: 100%     Exam   Awake Alert, Oriented X 3, No new F.N deficits, Normal affect [place].AT,PERRAL Supple Neck,No JVD, No cervical lymphadenopathy appriciated.  Symmetrical Chest wall movement, Good air movement bilaterally, CTAB RRR,No Gallops,Rubs or new Murmurs, No Parasternal Heave +ve B.Sounds, Abd Soft, Non tender, No organomegaly appriciated, No rebound - guarding or rigidity. No Cyanosis, Clubbing or edema, No new Rash or bruise     Data Review   Lab Results  Component Value Date   WBC 7.7 07/27/2012   HGB 15.3 07/27/2012   HCT 42.9 07/27/2012   MCV 86.0 07/27/2012   PLT 307 07/27/2012      Chemistry      Component Value Date/Time   NA 140 07/27/2012 1010   K 4.5 07/27/2012 1010   CL 105 07/27/2012 1010   CO2 27 07/27/2012 1010   BUN 10 07/27/2012 1010   CREATININE 1.04 07/27/2012 1010   CREATININE 1.12 03/16/2012 1530       Component Value Date/Time   CALCIUM 9.8 07/27/2012 1010   ALKPHOS 78 07/27/2012 1010   AST 23 07/27/2012 1010   ALT 17 07/27/2012 1010   BILITOT 0.6 07/27/2012 1010       Lab Results  Component Value Date   HGBA1C 5.3 05/13/2009    Lab Results  Component Value Date   CHOL 185 07/27/2012   HDL 52 07/27/2012   LDLCALC 119* 07/27/2012   TRIG 72 07/27/2012   CHOLHDL 3.6 07/27/2012    No results found for this basename: TSH    Lab Results  Component Value Date   PSA 0.70 05/13/2009      Prior to Admission medications   Medication Sig Start Date End Date Taking? Authorizing Provider  HYDROcodone-acetaminophen (NORCO/VICODIN) 5-325 MG per tablet Take 1 tablet by mouth every 4 (four) hours as needed for pain. 11/03/12   Lauren Doretha Imus, PA-C  ibuprofen (ADVIL,MOTRIN) 200 MG tablet Take 400 mg by mouth every 6 (six) hours as needed for pain.    Historical Provider, MD  penicillin v potassium (VEETID) 500 MG tablet Take 1 tablet (500 mg total) by mouth 3 (three) times daily. 11/03/12   Clabe Seal, PA-C     Assessment & Plan    History of genital herpes. He will use Valtrex when necessary,  already has medication with him.   History of marijuana use. Encouraged to quit the   Mild dyslipidemia with borderline LDL Mildly elevated cholesterol, heart healthy diet, 30 minutes of aerobic exercise 5 to 6 times per week. Repeat fasting lipid panel in one year. Written instructions given.    Routine health maintenance.  He refused flu shot  Leroy Sea M.D on 11/17/2012 at 10:58 AM

## 2014-07-02 ENCOUNTER — Ambulatory Visit: Payer: No Typology Code available for payment source

## 2014-11-30 ENCOUNTER — Ambulatory Visit: Payer: Self-pay | Admitting: Family Medicine

## 2014-12-10 ENCOUNTER — Encounter: Payer: Self-pay | Admitting: Family Medicine

## 2014-12-26 ENCOUNTER — Ambulatory Visit (INDEPENDENT_AMBULATORY_CARE_PROVIDER_SITE_OTHER): Payer: BLUE CROSS/BLUE SHIELD | Admitting: Family Medicine

## 2014-12-26 ENCOUNTER — Encounter: Payer: Self-pay | Admitting: Family Medicine

## 2014-12-26 VITALS — BP 133/72 | HR 70 | Temp 97.4°F | Resp 16 | Ht 70.0 in | Wt 236.4 lb

## 2014-12-26 DIAGNOSIS — Z6833 Body mass index (BMI) 33.0-33.9, adult: Secondary | ICD-10-CM

## 2014-12-26 DIAGNOSIS — F1220 Cannabis dependence, uncomplicated: Secondary | ICD-10-CM

## 2014-12-26 DIAGNOSIS — Z130 Encounter for screening for diseases of the blood and blood-forming organs and certain disorders involving the immune mechanism: Secondary | ICD-10-CM | POA: Diagnosis not present

## 2014-12-26 DIAGNOSIS — F1290 Cannabis use, unspecified, uncomplicated: Secondary | ICD-10-CM

## 2014-12-26 DIAGNOSIS — Z0000 Encounter for general adult medical examination without abnormal findings: Secondary | ICD-10-CM

## 2014-12-26 DIAGNOSIS — Z23 Encounter for immunization: Secondary | ICD-10-CM | POA: Diagnosis not present

## 2014-12-26 DIAGNOSIS — N5089 Other specified disorders of the male genital organs: Secondary | ICD-10-CM

## 2014-12-26 DIAGNOSIS — Z1389 Encounter for screening for other disorder: Secondary | ICD-10-CM

## 2014-12-26 DIAGNOSIS — Z113 Encounter for screening for infections with a predominantly sexual mode of transmission: Secondary | ICD-10-CM | POA: Diagnosis not present

## 2014-12-26 DIAGNOSIS — A6000 Herpesviral infection of urogenital system, unspecified: Secondary | ICD-10-CM

## 2014-12-26 LAB — HEMOGLOBIN A1C
Hgb A1c MFr Bld: 5.4 % (ref <5.7)
MEAN PLASMA GLUCOSE: 108 mg/dL (ref <117)

## 2014-12-26 LAB — CBC
HCT: 45.3 % (ref 39.0–52.0)
Hemoglobin: 15.6 g/dL (ref 13.0–17.0)
MCH: 29.1 pg (ref 26.0–34.0)
MCHC: 34.4 g/dL (ref 30.0–36.0)
MCV: 84.5 fL (ref 78.0–100.0)
MPV: 9.8 fL (ref 8.6–12.4)
Platelets: 313 10*3/uL (ref 150–400)
RBC: 5.36 MIL/uL (ref 4.22–5.81)
RDW: 13.4 % (ref 11.5–15.5)
WBC: 7.7 10*3/uL (ref 4.0–10.5)

## 2014-12-26 MED ORDER — FAMCICLOVIR 500 MG PO TABS: ORAL_TABLET | ORAL | Status: DC

## 2014-12-26 NOTE — Progress Notes (Signed)
Subjective:    Patient ID: Randall Rodriguez, male    DOB: 08/15/1985, 29 y.o.   MRN: [id number]  HPI This is a pleasant 29 yo male who presents today for CPE. Works [employer]. He enjoys his work. Lives with his mother and girlfriend. Has been with his girlfriend for 5 years, will probably get married and have children eventually.   Last CPE- many years Tdap- unknown Flu- declines Dental- over due Eye- over due Exercise- no  Past Medical History  Diagnosis Date  . GERD (gastroesophageal reflux disease)   . Smoker   . Genital herpes   . Marijuana smoker (HCC)    No past surgical history on file. Family History  Problem Relation Age of Onset  . Hypertension Father   . Diabetes Father   . Diabetes Brother    Social History  Substance Use Topics  . Smoking status: Former Smoker -- 2 years  . Smokeless tobacco: Never Used  . Alcohol Use: 0.0 oz/week    0 Standard drinks or equivalent per week     Comment: Occasional beer,wine and liquor     Review of Systems  Constitutional: Negative.   HENT: Negative.   Eyes: Negative.   Respiratory: Shortness of breath: wtih acid reflux symptoms.   Cardiovascular: Negative.   Gastrointestinal: Negative for nausea, abdominal pain, diarrhea and constipation.       Has had acid reflux for long time, has some burning after eating.   Endocrine: Negative.   Genitourinary: Positive for scrotal swelling (history of cyst. had evaluation in past, no us. ).  Musculoskeletal: Negative.   Skin: Negative.   Allergic/Immunologic: Negative.   Neurological: Negative.   Hematological: Negative.   Psychiatric/Behavioral: Negative.       Objective:   Physical Exam Physical Exam  Constitutional: He is oriented to person, place, and time. He appears well-developed and well-nourished. Obese. HENT:  Head: Normocephalic and atraumatic.  Right Ear: External ear normal.  Left Ear: External ear normal.  Nose: Nose normal.  Mouth/Throat:  Oropharynx is clear and moist.  Eyes: Conjunctivae are normal. Pupils are equal, round, and reactive to light.  Neck: Normal range of motion. Neck supple.  Cardiovascular: Normal rate, regular rhythm, normal heart sounds and intact distal pulses.   Pulmonary/Chest: Effort normal and breath sounds normal.  Abdominal: Soft. Bowel sounds are normal. Hernia confirmed negative in the right inguinal area and confirmed negative in the left inguinal area.  Genitourinary: Right testicle larger than left, unable to palpate any masses. Non tender. Circumcised.  Musculoskeletal: Normal range of motion. He exhibits no edema or tenderness.       Cervical back: Normal.       Thoracic back: Normal.       Lumbar back: Normal.  Lymphadenopathy:    He has no cervical adenopathy.       Right: No inguinal adenopathy present.       Left: No inguinal adenopathy present.  Neurological: He is alert and oriented to person, place, and time. He has normal reflexes.  Skin: Skin is warm and dry.  Psychiatric: He has a normal mood and affect. His behavior is normal. Judgment normal.  Vitals reviewed. BP 133/72 mmHg  Pulse 70  Temp(Src) 97.4 F (36.3 C) (Oral)  Resp 16  Ht 5\' 10"  (1.778 m)  Wt 236 lb 6.4 oz (107.23 kg)  BMI 33.92 kg/m2     Assessment & Plan:  1. Annual physical exam - Encouraged regular dental and eye  care  2. BMI 33.0-33.9,adult - discussed need to increase exercise, limit portions, eliminate sugary drinks, increase vegetables, fruits, lean protein - Lipid panel - TSH - Hemoglobin A1c - COMPLETE METABOLIC PANEL WITH GFR  3. Scrotal swelling - There is asymmetry in scrotal size, patient concerned, will proceed with ultra sound - US Scrotum; Future  4. Routine screening for STI (sexually transmitted infection) - GC/Chlamydia Probe Amp - HIV antibody - RPR  5. Screening for nephropathy - COMPLETE METABOLIC PANEL WITH GFR  6. Screening for deficiency anemia - CBC  7. Need for  Tdap vaccination - Tdap vaccine greater than or equal to 7yo IM  8. Herpes genitalis - currently with 3-4 occurences annually - famciclovir (FAMVIR) 500 MG tablet; Take two tablets by mouth twice a day for one day for outbreak.  Dispense: 12 tablet; Refill: 1  9. Marijuana smoker (HCC) - discussed impacts on body and benefits of quitting.  10. GERD - provided written and verbal information and encouraged him to identify triggers and avoid.  - Will try OTC H2 blocker, written instructions provided to patient  Olean Ree, FNP-BC  [facility]  12/27/2014 6:30 AM

## 2014-12-26 NOTE — Patient Instructions (Signed)
For acid reflux, you can try over the counter ranitidine or famotidine. Start with once a day for at least two weeks. Can increase to twice a day if no improvement. Safe to take daily long term or you can take as needed for occasional symptoms.   Gastroesophageal Reflux Disease, Adult Normally, food travels down the esophagus and stays in the stomach to be digested. However, when a person has gastroesophageal reflux disease (GERD), food and stomach acid move back up into the esophagus. When this happens, the esophagus becomes sore and inflamed. Over time, GERD can create small holes (ulcers) in the lining of the esophagus.  CAUSES This condition is caused by a problem with the muscle between the esophagus and the stomach (lower esophageal sphincter, or LES). Normally, the LES muscle closes after food passes through the esophagus to the stomach. When the LES is weakened or abnormal, it does not close properly, and that allows food and stomach acid to go back up into the esophagus. The LES can be weakened by certain dietary substances, medicines, and medical conditions, including:  Tobacco use.  Pregnancy.  Having a hiatal hernia.  Heavy alcohol use.  Certain foods and beverages, such as coffee, chocolate, onions, and peppermint. RISK FACTORS This condition is more likely to develop in:  People who have an increased body weight.  People who have connective tissue disorders.  People who use NSAID medicines. SYMPTOMS Symptoms of this condition include:  Heartburn.  Difficult or painful swallowing.  The feeling of having a lump in the throat.  Abitter taste in the mouth.  Bad breath.  Having a large amount of saliva.  Having an upset or bloated stomach.  Belching.  Chest pain.  Shortness of breath or wheezing.  Ongoing (chronic) cough or a night-time cough.  Wearing away of tooth enamel.  Weight loss. Different conditions can cause chest pain. Make sure to see your  health care provider if you experience chest pain. DIAGNOSIS Your health care provider will take a medical history and perform a physical exam. To determine if you have mild or severe GERD, your health care provider may also monitor how you respond to treatment. You may also have other tests, including:  An endoscopy toexamine your stomach and esophagus with a small camera.  A test thatmeasures the acidity level in your esophagus.  A test thatmeasures how much pressure is on your esophagus.  A barium swallow or modified barium swallow to show the shape, size, and functioning of your esophagus. TREATMENT The goal of treatment is to help relieve your symptoms and to prevent complications. Treatment for this condition may vary depending on how severe your symptoms are. Your health care provider may recommend:  Changes to your diet.  Medicine.  Surgery. HOME CARE INSTRUCTIONS Diet  Follow a diet as recommended by your health care provider. This may involve avoiding foods and drinks such as:  Coffee and tea (with or without caffeine).  Drinks that containalcohol.  Energy drinks and sports drinks.  Carbonated drinks or sodas.  Chocolate and cocoa.  Peppermint and mint flavorings.  Garlic and onions.  Horseradish.  Spicy and acidic foods, including peppers, chili powder, curry powder, vinegar, hot sauces, and barbecue sauce.  Citrus fruit juices and citrus fruits, such as oranges, lemons, and limes.  Tomato-based foods, such as red sauce, chili, salsa, and pizza with red sauce.  Fried and fatty foods, such as donuts, french fries, potato chips, and high-fat dressings.  High-fat meats, such as hot  dogs and fatty cuts of red and white meats, such as rib eye steak, sausage, ham, and bacon.  High-fat dairy items, such as whole milk, butter, and cream cheese.  Eat small, frequent meals instead of large meals.  Avoid drinking large amounts of liquid with your  meals.  Avoid eating meals during the 2-3 hours before bedtime.  Avoid lying down right after you eat.  Do not exercise right after you eat. General Instructions  Pay attention to any changes in your symptoms.  Take over-the-counter and prescription medicines only as told by your health care provider. Do not take aspirin, ibuprofen, or other NSAIDs unless your health care provider told you to do so.  Do not use any tobacco products, including cigarettes, chewing tobacco, and e-cigarettes. If you need help quitting, ask your health care provider.  Wear loose-fitting clothing. Do not wear anything tight around your waist that causes pressure on your abdomen.  Raise (elevate) the head of your bed 6 inches (15cm).  Try to reduce your stress, such as with yoga or meditation. If you need help reducing stress, ask your health care provider.  If you are overweight, reduce your weight to an amount that is healthy for you. Ask your health care provider for guidance about a safe weight loss goal.  Keep all follow-up visits as told by your health care provider. This is important. SEEK MEDICAL CARE IF:  You have new symptoms.  You have unexplained weight loss.  You have difficulty swallowing, or it hurts to swallow.  You have wheezing or a persistent cough.  Your symptoms do not improve with treatment.  You have a hoarse voice. SEEK IMMEDIATE MEDICAL CARE IF:  You have pain in your arms, neck, jaw, teeth, or back.  You feel sweaty, dizzy, or light-headed.  You have chest pain or shortness of breath.  You vomit and your vomit looks like blood or coffee grounds.  You faint.  Your stool is bloody or black.  You cannot swallow, drink, or eat.   This information is not intended to replace advice given to you by your health care provider. Make sure you discuss any questions you have with your health care provider.   Document Released: 10/01/2004 Document Revised: 09/12/2014  Document Reviewed: 04/18/2014 Elsevier Interactive Patient Education [employer]

## 2014-12-27 LAB — COMPLETE METABOLIC PANEL WITH GFR
ALK PHOS: 89 U/L (ref 40–115)
ALT: 18 U/L (ref 9–46)
AST: 28 U/L (ref 10–40)
Albumin: 4.6 g/dL (ref 3.6–5.1)
BUN: 13 mg/dL (ref 7–25)
CALCIUM: 10.2 mg/dL (ref 8.6–10.3)
CO2: 27 mmol/L (ref 20–31)
Chloride: 101 mmol/L (ref 98–110)
Creat: 0.94 mg/dL (ref 0.60–1.35)
GFR, Est African American: >89 mL/min (ref >=60)
Glucose, Bld: 100 mg/dL — ABNORMAL HIGH (ref 65–99)
POTASSIUM: 4.6 mmol/L (ref 3.5–5.3)
SODIUM: 138 mmol/L (ref 135–146)
Total Bilirubin: 0.6 mg/dL (ref 0.2–1.2)
Total Protein: 7.5 g/dL (ref 6.1–8.1)

## 2014-12-27 LAB — GC/CHLAMYDIA PROBE AMP
CT PROBE, AMP APTIMA: NOT DETECTED
GC Probe RNA: NOT DETECTED

## 2014-12-27 LAB — TSH: TSH: 2.577 u[IU]/mL (ref 0.350–4.500)

## 2014-12-27 LAB — LIPID PANEL
CHOLESTEROL: 230 mg/dL — AB (ref 125–200)
HDL: 48 mg/dL (ref >=40)
LDL Cholesterol: 164 mg/dL — ABNORMAL HIGH (ref <130)
Total CHOL/HDL Ratio: 4.8 Ratio (ref <=5.0)
Triglycerides: 88 mg/dL (ref <150)
VLDL: 18 mg/dL (ref <30)

## 2014-12-27 LAB — HIV ANTIBODY (ROUTINE TESTING W REFLEX): HIV 1&2 Ab, 4th Generation: NONREACTIVE

## 2014-12-27 LAB — RPR

## 2014-12-28 ENCOUNTER — Encounter: Payer: Self-pay | Admitting: Family Medicine

## 2015-01-01 ENCOUNTER — Ambulatory Visit
Admission: RE | Admit: 2015-01-01 | Discharge: 2015-01-01 | Disposition: A | Discharge status: Home / Self Care | Payer: BLUE CROSS/BLUE SHIELD | Source: Ambulatory Visit | Attending: Family Medicine | Admitting: Family Medicine

## 2015-01-01 DIAGNOSIS — N5089 Other specified disorders of the male genital organs: Secondary | ICD-10-CM

## 2015-12-26 ENCOUNTER — Ambulatory Visit (INDEPENDENT_AMBULATORY_CARE_PROVIDER_SITE_OTHER): Payer: BLUE CROSS/BLUE SHIELD | Admitting: Urgent Care

## 2015-12-26 ENCOUNTER — Encounter: Payer: Self-pay | Admitting: Urgent Care

## 2015-12-26 VITALS — BP 120/80 | HR 81 | Temp 98.9°F | Resp 16 | Ht 69.75 in | Wt 238.4 lb

## 2015-12-26 DIAGNOSIS — Z8639 Personal history of other endocrine, nutritional and metabolic disease: Secondary | ICD-10-CM

## 2015-12-26 DIAGNOSIS — B009 Herpesviral infection, unspecified: Secondary | ICD-10-CM | POA: Diagnosis not present

## 2015-12-26 DIAGNOSIS — Z113 Encounter for screening for infections with a predominantly sexual mode of transmission: Secondary | ICD-10-CM

## 2015-12-26 DIAGNOSIS — Z833 Family history of diabetes mellitus: Secondary | ICD-10-CM

## 2015-12-26 DIAGNOSIS — Z0000 Encounter for general adult medical examination without abnormal findings: Secondary | ICD-10-CM

## 2015-12-26 DIAGNOSIS — E669 Obesity, unspecified: Secondary | ICD-10-CM | POA: Diagnosis not present

## 2015-12-26 MED ORDER — VALACYCLOVIR HCL 500 MG PO TABS: 500.0000 mg | ORAL_TABLET | Freq: Two times a day (BID) | ORAL | 11 refills | Status: DC

## 2015-12-26 NOTE — Progress Notes (Signed)
MRN: [id number]  Subjective:   Mr. Darrel ReachRandy Ferreras is a 30 y.o. male presenting for annual physical exam.  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 Tries to eat much healthier. Is exercising more frequently. Denies smoking cigarettes.   Medical care team includes: PCP: No PCP Per Patient Vision: Wears eye glasses, has eye exams yearly. Dental: Cleanings every 6 months. Specialists: None.  Harvie HeckRandy has a current medication list which includes the following prescription(s): famciclovir. He has No Known Allergies.  Harvie HeckRandy  has a past medical history of Genital herpes; GERD (gastroesophageal reflux disease); Marijuana smoker (HCC); and Smoker. Also denies past surgical history.   His family history includes Diabetes in his brother and father; Hypertension in his father.  Immunizations: Refused flu shot.  Review of Systems  Constitutional: Negative for chills, diaphoresis, fever, malaise/fatigue and weight loss.  HENT: Negative for congestion, ear discharge, ear pain, hearing loss, nosebleeds, sore throat and tinnitus.   Eyes: Negative for blurred vision, double vision, photophobia, pain, discharge and redness.  Respiratory: Negative for cough, shortness of breath and wheezing.   Cardiovascular: Negative for chest pain, palpitations and leg swelling.  Gastrointestinal: Negative for abdominal pain, blood in stool, constipation, diarrhea, nausea and vomiting.  Genitourinary: Negative for dysuria, flank pain, frequency, hematuria and urgency.  Musculoskeletal: Negative for back pain, joint pain and myalgias.  Skin: Negative for itching and rash.  Neurological: Negative for dizziness, tingling, seizures, loss of consciousness, weakness and headaches.  Endo/Heme/Allergies: Negative for polydipsia.  Psychiatric/Behavioral: Negative for depression, hallucinations, memory loss, substance abuse and suicidal ideas. The patient is not nervous/anxious and does not have  insomnia.    Objective:   Vitals: BP 120/80   Pulse 81   Temp 98.9 F (37.2 C) (Oral)   Resp 16   Ht 5' 9.75" (1.772 m)   Wt 238 lb 6.4 oz (108.1 kg)   SpO2 99%   BMI 34.45 kg/m   Physical Exam  Constitutional: He is oriented to person, place, and time. He appears well-developed and well-nourished.  HENT:  TM's intact bilaterally, no effusions or erythema. Nasal turbinates pink and moist, nasal passages patent. No sinus tenderness. Oropharynx clear, mucous membranes moist, dentition in good repair.  Eyes: Conjunctivae and EOM are normal. Pupils are equal, round, and reactive to light. Right eye exhibits no discharge. Left eye exhibits no discharge. No scleral icterus.  Neck: Normal range of motion. Neck supple. No thyromegaly present.  Cardiovascular: Normal rate, regular rhythm and intact distal pulses.  Exam reveals no gallop and no friction rub.   No murmur heard. Pulmonary/Chest: No stridor. No respiratory distress. He has no wheezes. He has no rales.  Abdominal: Soft. Bowel sounds are normal. He exhibits no distension and no mass. There is no tenderness.  Musculoskeletal: Normal range of motion. He exhibits no edema or tenderness.  Lymphadenopathy:    He has no cervical adenopathy.  Neurological: He is alert and oriented to person, place, and time. He has normal reflexes.  Skin: Skin is warm and dry. No rash noted. No erythema. No pallor.  Psychiatric: He has a normal mood and affect.   Assessment and Plan :   1. Annual physical exam 2. Obesity, unspecified classification, unspecified obesity type, unspecified whether serious comorbidity present 3. History of hyperlipidemia 4. Family history of diabetes mellitus - Medically stable, labs pending. Discussed healthy lifestyle, diet, exercise, preventative care, vaccinations, and addressed patient's concerns.   5. Screen for STD (sexually transmitted disease) -  Labs pending  Wallis BambergMario Kamara Allan, PA-C [facility] [phone] 12/26/2015  9:15 AM

## 2015-12-26 NOTE — Patient Instructions (Addendum)

## 2015-12-27 LAB — CBC
Hematocrit: 48.3 % (ref 37.5–51.0)
Hemoglobin: 16.8 g/dL (ref 13.0–17.7)
MCH: 30 pg (ref 26.6–33.0)
MCHC: 34.8 g/dL (ref 31.5–35.7)
MCV: 86 fL (ref 79–97)
PLATELETS: 287 10*3/uL (ref 150–379)
RBC: 5.6 x10E6/uL (ref 4.14–5.80)
RDW: 14 % (ref 12.3–15.4)
WBC: 7.9 10*3/uL (ref 3.4–10.8)

## 2015-12-27 LAB — BASIC METABOLIC PANEL
BUN / CREAT RATIO: 13 (ref 9–20)
BUN: 14 mg/dL (ref 6–20)
CO2: 23 mmol/L (ref 18–29)
CREATININE: 1.11 mg/dL (ref 0.76–1.27)
Calcium: 10.1 mg/dL (ref 8.7–10.2)
Chloride: 99 mmol/L (ref 96–106)
GFR calc Af Amer: 104 mL/min/{1.73_m2} (ref >59)
GFR calc non Af Amer: 90 mL/min/{1.73_m2} (ref >59)
Glucose: 94 mg/dL (ref 65–99)
POTASSIUM: 4.6 mmol/L (ref 3.5–5.2)
SODIUM: 140 mmol/L (ref 134–144)

## 2015-12-27 LAB — HEMOGLOBIN A1C
ESTIMATED AVERAGE GLUCOSE: 111 mg/dL
Hgb A1c MFr Bld: 5.5 % (ref 4.8–5.6)

## 2015-12-27 LAB — LIPID PANEL
CHOL/HDL RATIO: 4.1 ratio (ref 0.0–5.0)
Cholesterol, Total: 207 mg/dL — ABNORMAL HIGH (ref 100–199)
HDL: 50 mg/dL (ref >39)
LDL CALC: 140 mg/dL — AB (ref 0–99)
TRIGLYCERIDES: 84 mg/dL (ref 0–149)
VLDL Cholesterol Cal: 17 mg/dL (ref 5–40)

## 2016-01-02 ENCOUNTER — Other Ambulatory Visit: Payer: Self-pay | Admitting: Urgent Care

## 2016-01-02 DIAGNOSIS — Z113 Encounter for screening for infections with a predominantly sexual mode of transmission: Secondary | ICD-10-CM

## 2016-02-04 ENCOUNTER — Telehealth: Payer: Self-pay

## 2016-02-04 NOTE — Telephone Encounter (Signed)
Pt is needing to talk with Randall Rodriguez about his blood work  [employer] [phone]

## 2016-02-05 ENCOUNTER — Ambulatory Visit (INDEPENDENT_AMBULATORY_CARE_PROVIDER_SITE_OTHER): Payer: BLUE CROSS/BLUE SHIELD | Admitting: Urgent Care

## 2016-02-05 VITALS — BP 124/80 | HR 65 | Temp 97.8°F | Resp 18 | Ht 69.75 in | Wt 247.0 lb

## 2016-02-05 DIAGNOSIS — N442 Benign cyst of testis: Secondary | ICD-10-CM

## 2016-02-05 DIAGNOSIS — B009 Herpesviral infection, unspecified: Secondary | ICD-10-CM | POA: Diagnosis not present

## 2016-02-05 DIAGNOSIS — Z113 Encounter for screening for infections with a predominantly sexual mode of transmission: Secondary | ICD-10-CM | POA: Diagnosis not present

## 2016-02-05 MED ORDER — VALACYCLOVIR HCL 500 MG PO TABS
500.0000 mg | ORAL_TABLET | Freq: Every day | ORAL | 5 refills | Status: AC
Start: 2016-02-05 — End: ?

## 2016-02-05 NOTE — Telephone Encounter (Signed)
Patient is being seen in clinic today.

## 2016-02-05 NOTE — Patient Instructions (Addendum)
Valacyclovir caplets What is this medicine? VALACYCLOVIR (val ay SYE kloe veer) is an antiviral medicine. It is used to treat or prevent infections caused by certain kinds of viruses. Examples of these infections include herpes and shingles. This medicine will not cure herpes. This medicine may be used for other purposes; ask your health care provider or pharmacist if you have questions. COMMON BRAND NAME(S): Valtrex What should I tell my health care provider before I take this medicine? They need to know if you have any of these conditions: -acquired immunodeficiency syndrome (AIDS) -any other condition that may weaken the immune system -bone marrow or kidney transplant -kidney disease -an unusual or allergic reaction to valacyclovir, acyclovir, ganciclovir, valganciclovir, other medicines, foods, dyes, or preservatives -pregnant or trying to get pregnant -breast-feeding How should I use this medicine? Take this medicine by mouth with a glass of water. Follow the directions on the prescription label. You can take this medicine with or without food. Take your doses at regular intervals. Do not take your medicine more often than directed. Finish the full course prescribed by your doctor or health care professional even if you think your condition is better. Do not stop taking except on the advice of your doctor or health care professional. Talk to your pediatrician regarding the use of this medicine in children. While this drug may be prescribed for children as young as 2 years for selected conditions, precautions do apply. Overdosage: If you think you have taken too much of this medicine contact a poison control center or emergency room at once. NOTE: This medicine is only for you. Do not share this medicine with others. What if I miss a dose? If you miss a dose, take it as soon as you can. If it is almost time for your next dose, take only that dose. Do not take double or extra doses. What may  interact with this medicine? -cimetidine -probenecid This list may not describe all possible interactions. Give your health care provider a list of all the medicines, herbs, non-prescription drugs, or dietary supplements you use. Also tell them if you smoke, drink alcohol, or use illegal drugs. Some items may interact with your medicine. What should I watch for while using this medicine? Tell your doctor or health care professional if your symptoms do not start to get better after 1 week. This medicine works best when taken early in the course of an infection, within the first 59 hours. Begin treatment as soon as possible after the first signs of infection like tingling, itching, or pain in the affected area. It is possible that genital herpes may still be spread even when you are not having symptoms. Always use safer sex practices like condoms made of latex or polyurethane whenever you have sexual contact. You should stay well hydrated while taking this medicine. Drink plenty of fluids. What side effects may I notice from receiving this medicine? Side effects that you should report to your doctor or health care professional as soon as possible: -allergic reactions like skin rash, itching or hives, swelling of the face, lips, or tongue -aggressive behavior -confusion -hallucinations -problems with balance, talking, walking -stomach pain -tremor -trouble passing urine or change in the amount of urine Side effects that usually do not require medical attention (report to your doctor or health care professional if they continue or are bothersome): -dizziness -headache -nausea, vomiting This list may not describe all possible side effects. Call your doctor for medical advice about side effects. You  may report side effects to FDA at 1-800-FDA-1088. Where should I keep my medicine? Keep out of the reach of children. Store at room temperature between 15 and 25 degrees C (59 and 77 degrees F). Keep  container tightly closed. Throw away any unused medicine after the expiration date. NOTE: This sheet is a summary. It may not cover all possible information. If you have questions about this medicine, talk to your doctor, pharmacist, or health care provider.  2017 Elsevier/Gold Standard (2011-12-08 16:34:05)     IF you received an x-ray today, you will receive an invoice from [facility]. Please contact [facility] at [phone] with questions or concerns regarding your invoice.   IF you received labwork today, you will receive an invoice from [facility]. Please contact [facility] at [phone] with questions or concerns regarding your invoice.   Our billing staff will not be able to assist you with questions regarding bills from these companies.  You will be contacted with the lab results as soon as they are available. The fastest way to get your results is to activate your My Chart account. Instructions are located on the last page of this paperwork. If you have not heard from us regarding the results in 2 weeks, please contact this office.

## 2016-02-05 NOTE — Progress Notes (Signed)
   MRN: [id number] DOB: 12/10/1985  Subjective:   Randall Rodriguez is a 31 y.o. male presenting for follow up on genital herpes. Patient has liked Valtrex. He is using it with good results. However, today reports that he has been having a break out each time after sex. Denies dysuria, hematuria, urinary frequency, penile discharge, penile swelling, testicular pain, testicular swelling, anal pain, groin pain. He did complete his future labs for his STI testing. Patient would also like a referral to urology for consult on his history of testicular cysts.  Randall Rodriguez has a current medication list which includes the following prescription(s): valacyclovir and famciclovir. Also has No Known Allergies.  Randall Rodriguez  has a past medical history of Genital herpes; GERD (gastroesophageal reflux disease); Marijuana smoker (HCC); and Smoker. Also  has no past surgical history on file.  Objective:   Vitals: BP 124/80   Pulse 65   Temp 97.8 F (36.6 C) (Oral)   Resp 18   Ht 5' 9.75" (1.772 m)   Wt 247 lb (112 kg)   SpO2 99%   BMI 35.70 kg/m   Physical Exam  Constitutional: He is oriented to person, place, and time. He appears well-developed and well-nourished.  Cardiovascular: Normal rate.   Pulmonary/Chest: Effort normal.  Neurological: He is alert and oriented to person, place, and time.   Assessment and Plan :   1. HSV (herpes simplex virus) infection 2. Routine screening for STI (sexually transmitted infection) - I will have patient start suppression therapy. Start Valtrex daily. RTC in 4-6 months or sooner if no improvement. Labs pending.  3. Testicular cyst - Ambulatory referral to Urology   Wallis BambergMario Fortune Brannigan, PA-C [facility] [phone] 02/05/2016 10:54 AM

## 2016-02-06 LAB — RPR: RPR Ser Ql: NONREACTIVE

## 2016-02-06 LAB — HIV ANTIBODY (ROUTINE TESTING W REFLEX): HIV Screen 4th Generation wRfx: NONREACTIVE

## 2016-02-07 LAB — TRICHOMONAS VAGINALIS, PROBE AMP: Trich vag by NAA: NEGATIVE

## 2016-02-07 LAB — GC/CHLAMYDIA PROBE AMP
Chlamydia trachomatis, NAA: NEGATIVE
Neisseria gonorrhoeae by PCR: NEGATIVE

## 2016-06-04 ENCOUNTER — Ambulatory Visit: Payer: BLUE CROSS/BLUE SHIELD | Admitting: Urgent Care

## 2016-06-12 ENCOUNTER — Encounter: Payer: Self-pay | Admitting: Urgent Care

## 2016-06-12 ENCOUNTER — Ambulatory Visit (INDEPENDENT_AMBULATORY_CARE_PROVIDER_SITE_OTHER): Payer: BLUE CROSS/BLUE SHIELD | Admitting: Urgent Care

## 2016-06-12 VITALS — BP 123/85 | HR 64 | Temp 97.9°F | Resp 16 | Ht 69.75 in | Wt 249.0 lb

## 2016-06-12 DIAGNOSIS — A6000 Herpesviral infection of urogenital system, unspecified: Secondary | ICD-10-CM

## 2016-06-12 NOTE — Patient Instructions (Addendum)
Finish your current supply of Valtrex for suppression therapy of herpes. Once you are done, do not take this medicine anymore. If you have a break out, then call the clinic and I will treat the break out and then have you restart your suppression therapy as you currently take it.    Valacyclovir caplets What is this medicine? VALACYCLOVIR (val ay SYE kloe veer) is an antiviral medicine. It is used to treat or prevent infections caused by certain kinds of viruses. Examples of these infections include herpes and shingles. This medicine will not cure herpes. This medicine may be used for other purposes; ask your health care provider or pharmacist if you have questions. COMMON BRAND NAME(S): Valtrex What should I tell my health care provider before I take this medicine? They need to know if you have any of these conditions: -acquired immunodeficiency syndrome (AIDS) -any other condition that may weaken the immune system -bone marrow or kidney transplant -kidney disease -an unusual or allergic reaction to valacyclovir, acyclovir, ganciclovir, valganciclovir, other medicines, foods, dyes, or preservatives -pregnant or trying to get pregnant -breast-feeding How should I use this medicine? Take this medicine by mouth with a glass of water. Follow the directions on the prescription label. You can take this medicine with or without food. Take your doses at regular intervals. Do not take your medicine more often than directed. Finish the full course prescribed by your doctor or health care professional even if you think your condition is better. Do not stop taking except on the advice of your doctor or health care professional. Talk to your pediatrician regarding the use of this medicine in children. While this drug may be prescribed for children as young as 2 years for selected conditions, precautions do apply. Overdosage: If you think you have taken too much of this medicine contact a poison control  center or emergency room at once. NOTE: This medicine is only for you. Do not share this medicine with others. What if I miss a dose? If you miss a dose, take it as soon as you can. If it is almost time for your next dose, take only that dose. Do not take double or extra doses. What may interact with this medicine? -cimetidine -probenecid This list may not describe all possible interactions. Give your health care provider a list of all the medicines, herbs, non-prescription drugs, or dietary supplements you use. Also tell them if you smoke, drink alcohol, or use illegal drugs. Some items may interact with your medicine. What should I watch for while using this medicine? Tell your doctor or health care professional if your symptoms do not start to get better after 1 week. This medicine works best when taken early in the course of an infection, within the first 72 hours. Begin treatment as soon as possible after the first signs of infection like tingling, itching, or pain in the affected area. It is possible that genital herpes may still be spread even when you are not having symptoms. Always use safer sex practices like condoms made of latex or polyurethane whenever you have sexual contact. You should stay well hydrated while taking this medicine. Drink plenty of fluids. What side effects may I notice from receiving this medicine? Side effects that you should report to your doctor or health care professional as soon as possible: -allergic reactions like skin rash, itching or hives, swelling of the face, lips, or tongue -aggressive behavior -confusion -hallucinations -problems with balance, talking, walking -stomach pain -tremor -trouble passing  urine or change in the amount of urine Side effects that usually do not require medical attention (report to your doctor or health care professional if they continue or are bothersome): -dizziness -headache -nausea, vomiting This list may not  describe all possible side effects. Call your doctor for medical advice about side effects. You may report side effects to FDA at 1-800-FDA-1088. Where should I keep my medicine? Keep out of the reach of children. Store at room temperature between 15 and 25 degrees C (59 and 77 degrees F). Keep container tightly closed. Throw away any unused medicine after the expiration date. NOTE: This sheet is a summary. It may not cover all possible information. If you have questions about this medicine, talk to your doctor, pharmacist, or health care provider.  2018 Elsevier/Gold Standard (2011-12-08 16:34:05)     IF you received an x-ray today, you will receive an invoice from [facility]. Please contact [facility] at [phone] with questions or concerns regarding your invoice.   IF you received labwork today, you will receive an invoice from [facility]. Please contact [facility] at [phone] with questions or concerns regarding your invoice.   Our billing staff will not be able to assist you with questions regarding bills from these companies.  You will be contacted with the lab results as soon as they are available. The fastest way to get your results is to activate your My Chart account. Instructions are located on the last page of this paperwork. If you have not heard from us regarding the results in 2 weeks, please contact this office.

## 2016-06-12 NOTE — Progress Notes (Signed)
  MRN: [id number] DOB: 04/23/1985  Subjective:   Randall Rodriguez is a 31 y.o. male presenting for chief complaint of Follow-up (HSV)  Patient has been on suppression therapy for ~5 months now. Reports that he has done better with Valtrex. Denies outbreaks, dysuria, urinary frequency, genital rashes.   Randall Rodriguez has a current medication list which includes the following prescription(s): valacyclovir. Also has No Known Allergies. Randall Rodriguez  has a past medical history of Genital herpes; GERD (gastroesophageal reflux disease); Marijuana smoker; and Smoker. Also  has no past surgical history on file.  Objective:   Vitals: BP 123/85   Pulse 64   Temp 97.9 F (36.6 C) (Oral)   Resp 16   Ht 5' 9.75" (1.772 m)   Wt 249 lb (112.9 kg)   SpO2 98%   BMI 35.98 kg/m   Physical Exam  Constitutional: He is oriented to person, place, and time. He appears well-developed and well-nourished.  Cardiovascular: Normal rate.   Pulmonary/Chest: Effort normal.  Neurological: He is alert and oriented to person, place, and time.   Assessment and Plan :   1. Genital herpes simplex, unspecified site - I discussed suppression therapy with patient. We will try to have him come off of Valtrex. If he experiences an outbreak, we will treat the episode and get him back on suppression therapy.  [name], PA-C [facility] at [facility] [phone] 06/12/2016  10:37 AM

## 2016-06-13 LAB — COMPREHENSIVE METABOLIC PANEL
ALK PHOS: 79 IU/L (ref 39–117)
ALT: 16 IU/L (ref 0–44)
AST: 22 IU/L (ref 0–40)
Albumin/Globulin Ratio: 1.7 (ref 1.2–2.2)
Albumin: 4.4 g/dL (ref 3.5–5.5)
BUN/Creatinine Ratio: 9 (ref 9–20)
BUN: 11 mg/dL (ref 6–20)
Bilirubin Total: 0.5 mg/dL (ref 0.0–1.2)
CO2: 23 mmol/L (ref 18–29)
CREATININE: 1.16 mg/dL (ref 0.76–1.27)
Calcium: 9.5 mg/dL (ref 8.7–10.2)
Chloride: 105 mmol/L (ref 96–106)
GFR calc Af Amer: 99 mL/min/{1.73_m2} (ref >59)
GFR calc non Af Amer: 85 mL/min/{1.73_m2} (ref >59)
GLUCOSE: 89 mg/dL (ref 65–99)
Globulin, Total: 2.6 g/dL (ref 1.5–4.5)
Potassium: 4.5 mmol/L (ref 3.5–5.2)
Sodium: 143 mmol/L (ref 134–144)
Total Protein: 7 g/dL (ref 6.0–8.5)

## 2016-09-16 ENCOUNTER — Encounter: Payer: Self-pay | Admitting: Emergency Medicine

## 2016-09-16 ENCOUNTER — Ambulatory Visit (INDEPENDENT_AMBULATORY_CARE_PROVIDER_SITE_OTHER): Payer: BLUE CROSS/BLUE SHIELD | Admitting: Emergency Medicine

## 2016-09-16 ENCOUNTER — Ambulatory Visit (INDEPENDENT_AMBULATORY_CARE_PROVIDER_SITE_OTHER): Payer: BLUE CROSS/BLUE SHIELD

## 2016-09-16 VITALS — BP 134/69 | HR 70 | Temp 98.4°F | Resp 16 | Ht 69.0 in | Wt 242.8 lb

## 2016-09-16 DIAGNOSIS — M7711 Lateral epicondylitis, right elbow: Secondary | ICD-10-CM

## 2016-09-16 DIAGNOSIS — S39012A Strain of muscle, fascia and tendon of lower back, initial encounter: Secondary | ICD-10-CM

## 2016-09-16 MED ORDER — DICLOFENAC SODIUM 75 MG PO TBEC: 75.0000 mg | DELAYED_RELEASE_TABLET | Freq: Two times a day (BID) | ORAL | 0 refills | Status: AC
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
## 2016-09-16 NOTE — Patient Instructions (Addendum)
     IF you received an x-ray today, you will receive an invoice from [facility]. Please contact [facility] at [phone] with questions or concerns regarding your invoice.   IF you received labwork today, you will receive an invoice from [facility]. Please contact [facility] at [phone] with questions or concerns regarding your invoice.   Our billing staff will not be able to assist you with questions regarding bills from these companies.  You will be contacted with the lab results as soon as they are available. The fastest way to get your results is to activate your My Chart account. Instructions are located on the last page of this paperwork. If you have not heard from us regarding the results in 2 weeks, please contact this office.      Back Pain, Adult Back pain is very common. The pain often gets better over time. The cause of back pain is usually not dangerous. Most people can learn to manage their back pain on their own. Follow these instructions at home: Watch your back pain for any changes. The following actions may help to lessen any pain you are feeling:  Stay active. Start with short walks on flat ground if you can. Try to walk farther each day.  Exercise regularly as told by your doctor. Exercise helps your back heal faster. It also helps avoid future injury by keeping your muscles strong and flexible.  Do not sit, drive, or stand in one place for more than 30 minutes.  Do not stay in bed. Resting more than 1-2 days can slow down your recovery.  Be careful when you bend or lift an object. Use good form when lifting: ? Bend at your knees. ? Keep the object close to your body. ? Do not twist.  Sleep on a firm mattress. Lie on your side, and bend your knees. If you lie on your back, put a pillow under your knees.  Take medicines only as told by your doctor.  Put ice on the injured area. ? Put ice in a plastic bag. ? Place a towel between your  skin and the bag. ? Leave the ice on for 20 minutes, 2-3 times a day for the first 2-3 days. After that, you can switch between ice and heat packs.  Avoid feeling anxious or stressed. Find good ways to deal with stress, such as exercise.  Maintain a healthy weight. Extra weight puts stress on your back.  Contact a doctor if:  You have pain that does not go away with rest or medicine.  You have worsening pain that goes down into your legs or buttocks.  You have pain that does not get better in one week.  You have pain at night.  You lose weight.  You have a fever or chills. Get help right away if:  You cannot control when you poop (bowel movement) or pee (urinate).  Your arms or legs feel weak.  Your arms or legs lose feeling (numbness).  You feel sick to your stomach (nauseous) or throw up (vomit).  You have belly (abdominal) pain.  You feel like you may pass out (faint). This information is not intended to replace advice given to you by your health care provider. Make sure you discuss any questions you have with your health care provider. Document Released: 06/10/2007 Document Revised: 05/30/2015 Document Reviewed: 04/25/2013 Elsevier Interactive Patient Education  2018 Elsevier Inc.  

## 2016-10-09 ENCOUNTER — Ambulatory Visit: Payer: BLUE CROSS/BLUE SHIELD | Admitting: Emergency Medicine

## 2016-10-14 ENCOUNTER — Ambulatory Visit: Payer: BLUE CROSS/BLUE SHIELD | Admitting: Emergency Medicine

## 2016-10-16 ENCOUNTER — Emergency Department (HOSPITAL_COMMUNITY): Payer: BLUE CROSS/BLUE SHIELD

## 2016-10-16 ENCOUNTER — Encounter (HOSPITAL_COMMUNITY): Payer: Self-pay | Admitting: Emergency Medicine

## 2016-10-16 ENCOUNTER — Emergency Department (HOSPITAL_COMMUNITY)
Admission: EM | Admit: 2016-10-16 | Discharge: 2016-10-16 | Disposition: A | Discharge status: Home / Self Care | Payer: BLUE CROSS/BLUE SHIELD | Attending: Emergency Medicine | Admitting: Emergency Medicine

## 2016-10-16 DIAGNOSIS — R079 Chest pain, unspecified: Secondary | ICD-10-CM

## 2016-10-16 DIAGNOSIS — Z87891 Personal history of nicotine dependence: Secondary | ICD-10-CM | POA: Insufficient documentation

## 2016-10-16 LAB — BASIC METABOLIC PANEL
ANION GAP: 7 (ref 5–15)
BUN: 8 mg/dL (ref 6–20)
CALCIUM: 9.5 mg/dL (ref 8.9–10.3)
CO2: 24 mmol/L (ref 22–32)
CREATININE: 1.11 mg/dL (ref 0.61–1.24)
Chloride: 106 mmol/L (ref 101–111)
GFR calc non Af Amer: >60 mL/min (ref >60)
Glucose, Bld: 104 mg/dL — ABNORMAL HIGH (ref 65–99)
Potassium: 4 mmol/L (ref 3.5–5.1)
SODIUM: 137 mmol/L (ref 135–145)

## 2016-10-16 LAB — CBC
HEMATOCRIT: 44.5 % (ref 39.0–52.0)
HEMOGLOBIN: 15.5 g/dL (ref 13.0–17.0)
MCH: 29.6 pg (ref 26.0–34.0)
MCHC: 34.8 g/dL (ref 30.0–36.0)
MCV: 84.9 fL (ref 78.0–100.0)
Platelets: 285 10*3/uL (ref 150–400)
RBC: 5.24 MIL/uL (ref 4.22–5.81)
RDW: 12.1 % (ref 11.5–15.5)
WBC: 8 10*3/uL (ref 4.0–10.5)

## 2016-10-16 LAB — I-STAT TROPONIN, ED: Troponin i, poc: 0 ng/mL (ref 0.00–0.08)

## 2016-10-16 NOTE — ED Triage Notes (Signed)
Pt reports 2-3 day hx of left sided chest pain and SOB. Pt denies recent fever, cough, wheezing, trauma, injury, radiation of pain. VSS. Pt NAD at present.
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
## 2016-10-16 NOTE — Discharge Instructions (Signed)
Screening tests were within normal limits. Tylenol for pain. Recommend over-the-counter products for stomach irritation.  Follow-up your primary care doctor.

## 2019-03-06 ENCOUNTER — Ambulatory Visit (INDEPENDENT_AMBULATORY_CARE_PROVIDER_SITE_OTHER): Payer: 59 | Admitting: Internal Medicine

## 2019-03-06 ENCOUNTER — Other Ambulatory Visit: Payer: Self-pay

## 2019-03-06 ENCOUNTER — Encounter: Payer: Self-pay | Admitting: Internal Medicine

## 2019-03-06 VITALS — BP 126/80 | HR 78 | Ht 70.0 in | Wt 259.8 lb

## 2019-03-06 DIAGNOSIS — Z8616 Personal history of COVID-19: Secondary | ICD-10-CM

## 2019-03-06 DIAGNOSIS — R0600 Dyspnea, unspecified: Secondary | ICD-10-CM

## 2019-03-06 DIAGNOSIS — R0609 Other forms of dyspnea: Secondary | ICD-10-CM

## 2019-03-06 NOTE — Progress Notes (Signed)
OV 03/06/2019  Subjective:  Patient ID: Randall Rodriguez, male , DOB: 11/03/1985 , age 34 y.o. , MRN: [id number] , ADDRESS: [address] [place] [facility] [id number]   03/06/2019 -   Chief Complaint  Patient presents with  . Consult    Pt was tested for covid 1/2 and tested positive for it. Pt still has complaints of SOB from covid. Pt denies any complaints of cough or CP.     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  He tells me that early January 2021 approximately 2 months ago he was diagnosed with Covid.  Prior to that in December 2020 he was given a clean bill of health with a physical exam by his primary care physician's office.  With the Covid he only had minimal symptoms.  He shows a text message where he got prednisone pack, azithromycin vitamins as part of his outpatient management.  Then he started taking Mulllein for "lung cleanse" supplement - since 1 week after covid dx. Still taking it.  He says this is helped him.  He says that since the diagnosis of COVID-19 he has insidious onset of shortness of breath with exertion relieved by rest.  It is definitely worse than his pre-Covid baseline.  Prior to Covid he had extremely mild dyspnea on exertion that he attributed to his obesity.  Since then for climbing a flight of stairs or doing stuff he feels short of breath.  He says he used to suffer from acid reflux and he attributed this also to his previous shortness of breath but this is currently baseline.  He states he underwent pulmonary function testing at [facility] and it was abnormal.  He then spoke to his colleague who is my patient and decided to come here to get checked out.  He denies any cough or wheezing.  No orthopnea proximal nocturnal dyspnea.  He does snore at night but denies any apneic spells.  He says he needs to lose around 60 pounds of weight.  Walking desaturation test 185 feet x 3 laps on room air: 100% pulse ox on room  air at rest with heart rate of 72/min at rest..  Final pulse ox after 3 laps was 98% with a heart rate of 92/min.  The pace was reported as average.  With mild shortness of breath.  Ct chst July 2014 - personally visualized and interpreted - looks clear lung fields  Blood work 2018 -> creat 1.1mg % and hgb 15.5g%  ROS - per HPI     has a past medical history of Genital herpes, GERD (gastroesophageal reflux disease), Marijuana smoker, and Smoker.   reports that he has quit smoking. He quit after 2.00 years of use. He has never used smokeless tobacco.  No past surgical history on file.  No Known Allergies  Immunization History  Administered Date(s) Administered  . Tdap 12/26/2014    Family History  Problem Relation Age of Onset  . Hypertension Father   . Diabetes Father   . Diabetes Brother      Current Outpatient Medications:  .  valACYclovir (VALTREX) 500 MG tablet, Take 1 tablet (500 mg total) by mouth daily., Disp: 30 tablet, Rfl: 5      Objective:   Vitals:   03/06/19 0858  BP: 126/80  Pulse: 78  SpO2: 98%  Weight: 259 lb 12.8 oz (117.8 kg)  Height: 5\' 10"  (1.778 m)    Estimated  body mass index is 37.28 kg/m as calculated from the following:   Height as of this encounter: 5\' 10"  (1.778 m).   Weight as of this encounter: 259 lb 12.8 oz (117.8 kg).  @WEIGHTCHANGE @    03/06/19 0858  Weight: 259 lb 12.8 oz (117.8 kg)     Physical Exam  Obese male muscular.  Alert and oriented x3.  Speech is normal.  Mallampati class III.  No neck nodes no elevated JVP.  Clear to auscultation bilaterally.  Normal heart sounds.  Visceral obesity present no cyanosis no clubbing no edema.  Essentially nonfocal exam beyond obesity.      Assessment:       ICD-10-CM   1. Dyspnea on exertion  R06.00   2. History of 2019 novel coronavirus disease (COVID-19)  Z86.16        Plan:     Patient Instructions     ICD-10-CM   1. Dyspnea on exertion  R06.00     2. History of 2019 novel coronavirus disease (COVID-19)  Z86.16     You are most likely suffering from post viral shortness of breath due to physical deconditioning. This is most likely wear itself out in the next few months Exercise therapy can help However, we can rule out any Covid related lung inflammation and also stress to the heart either due to Covid or what is called diastolic dysfunction unrelated to Covid  Plan -Get high-resolution CT scan of the chest supine and prone -Get 2D echocardiogram  Follow-up -15-minute telephone visit with nurse practitioner or myself in the next few weeks but after the above to go over the test results     SIGNATURE    Dr. 2020, M.D., F.C.C.P,  Pulmonary and Critical Care Medicine Staff Physician, [facility] Director - Interstitial Lung Disease  Program  Pulmonary Fibrosis [facility] at [facility] [place], [facility], [place]  Pager: [phone], If no answer or between  15:00h - 7:00h: call [phone] Telephone: [phone]  9:27 AM 03/06/2019

## 2019-03-06 NOTE — Addendum Note (Signed)
Addended by: Wyvonne Lenz on: 03/06/2019 09:48 AM   Modules accepted: Orders

## 2019-03-06 NOTE — Patient Instructions (Signed)
ICD-10-CM   1. Dyspnea on exertion  R06.00   2. History of 2019 novel coronavirus disease (COVID-19)  Z86.16     You are most likely suffering from post viral shortness of breath due to physical deconditioning. This is most likely wear itself out in the next few months Exercise therapy can help However, we can rule out any Covid related lung inflammation and also stress to the heart either due to Covid or what is called diastolic dysfunction unrelated to Covid  Plan -Get high-resolution CT scan of the chest supine and prone -Get 2D echocardiogram  Follow-up -15-minute telephone visit with nurse practitioner or myself in the next few weeks but after the above to go over the test results

## 2019-03-17 ENCOUNTER — Ambulatory Visit (HOSPITAL_COMMUNITY): Payer: 59 | Attending: Cardiovascular Disease

## 2019-03-17 ENCOUNTER — Ambulatory Visit (INDEPENDENT_AMBULATORY_CARE_PROVIDER_SITE_OTHER)
Admission: RE | Admit: 2019-03-17 | Discharge: 2019-03-17 | Disposition: A | Discharge status: Home / Self Care | Payer: 59 | Source: Ambulatory Visit | Attending: Internal Medicine | Admitting: Internal Medicine

## 2019-03-17 DIAGNOSIS — R0600 Dyspnea, unspecified: Secondary | ICD-10-CM

## 2019-03-17 DIAGNOSIS — R0609 Other forms of dyspnea: Secondary | ICD-10-CM

## 2019-04-06 NOTE — Progress Notes (Signed)
Patient made aware of results of echo.

## 2019-04-06 NOTE — Progress Notes (Signed)
CT chest high resolution is normal  IMPRESSION: Normal exam. No evidence of interstitial lung disease or post COVID-19 fibrosis.   Electronically Signed   By: Leanna Battles M.D.   On: 03/17/2019 16:38

## 2019-04-06 NOTE — Progress Notes (Signed)
Patient aware of results of CT scan.

## 2019-04-06 NOTE — Progress Notes (Signed)
Normal echo.

## 2019-04-06 NOTE — Telephone Encounter (Signed)
Dr. Marchelle Gearing please call pt.

## 2019-04-16 NOTE — Telephone Encounter (Signed)
Can you please schedule a 15 min phine visit this week. Anytime there is slot . OTherwise double book on Friday this week 04/21/19 when I am in [facility] clinic

## 2019-04-21 ENCOUNTER — Ambulatory Visit (INDEPENDENT_AMBULATORY_CARE_PROVIDER_SITE_OTHER): Payer: 59 | Admitting: Internal Medicine

## 2019-04-21 DIAGNOSIS — R0600 Dyspnea, unspecified: Secondary | ICD-10-CM | POA: Diagnosis not present

## 2019-04-21 DIAGNOSIS — R0609 Other forms of dyspnea: Secondary | ICD-10-CM

## 2019-04-21 DIAGNOSIS — Z8616 Personal history of COVID-19: Secondary | ICD-10-CM

## 2019-04-21 NOTE — Progress Notes (Signed)
OV 03/06/2019  Subjective:  Patient ID: Randall Rodriguez, male , DOB: 1985-12-29 , age 34 y.o. , MRN: [id number] , ADDRESS: [phone] [place] [facility] [id number]   03/06/2019 -   Chief Complaint  Patient presents with  . Consult    Pt was tested for covid 1/2 and tested positive for it. Pt still has complaints of SOB from covid. Pt denies any complaints of cough or CP.     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  He tells me that early January 2021 approximately 2 months ago he was diagnosed with Covid.  Prior to that in December 2020 he was given a clean bill of health with a physical exam by his primary care physician's office.  With the Covid he only had minimal symptoms.  He shows a text message where he got prednisone pack, azithromycin vitamins as part of his outpatient management.  Then he started taking Mulllein for "lung cleanse" supplement - since 1 week after covid dx. Still taking it.  He says this is helped him.  He says that since the diagnosis of COVID-19 he has insidious onset of shortness of breath with exertion relieved by rest.  It is definitely worse than his pre-Covid baseline.  Prior to Covid he had extremely mild dyspnea on exertion that he attributed to his obesity.  Since then for climbing a flight of stairs or doing stuff he feels short of breath.  He says he used to suffer from acid reflux and he attributed this also to his previous shortness of breath but this is currently baseline.  He states he underwent pulmonary function testing at [facility] and it was abnormal.  He then spoke to his colleague who is my patient and decided to come here to get checked out.  He denies any cough or wheezing.  No orthopnea proximal nocturnal dyspnea.  He does snore at night but denies any apneic spells.  He says he needs to lose around 60 pounds of weight.  Walking desaturation test 185 feet x 3 laps on room air: 100% pulse ox on room air  at rest with heart rate of 72/min at rest..  Final pulse ox after 3 laps was 98% with a heart rate of 92/min.  The pace was reported as average.  With mild shortness of breath.  Ct chst July 2014 - personally visualized and interpreted - looks clear lung fields  Blood work 2018 -> creat 1.1mg % and hgb 15.5g%  xxxxxxxxxxxxxxxxxxxxxxxxxxxxxxxxxxxxxxxxxxxxxxxxx  OV 04/21/2019 - telephone visit  Subjective:  Patient ID: Randall Rodriguez, male , DOB: [birth year] , age 57 y.o. , MRN: [id number] , ADDRESS: [place] [id number]   04/21/2019 -  Post covid telephone visit to discuss results      HPI Randall Rodriguez 34 y.o. -  S: feeling better. Improved dyspnea   O - results both CT and echo are normal    CT chest high resolution 03/17/19  IMPRESSION: Normal exam. No evidence of interstitial lung disease or post COVID-19 fibrosis.   Electronically Signed   By: Lorin Picket M.D.   On: 03/17/2019 16:38 ROS - per HPI   IMPRESSIONS   ECHO 03/17/19   1. Left ventricular ejection fraction, by estimation, is 60 to 65%. The  left ventricle has normal function. The left ventricle has no regional  wall motion abnormalities. Left ventricular diastolic parameters were  normal. The average left ventricular  global longitudinal strain is -18.8 %.  2. Right ventricular systolic function is normal. The right ventricular  size is normal.  3. The mitral valve is normal in structure. No evidence of mitral valve  regurgitation. No evidence of mitral stenosis.  4. The aortic valve is normal in structure. Aortic valve regurgitation is  not visualized. No aortic stenosis is present.  5. The inferior vena cava is normal in size with greater than 50%  respiratory variability, suggesting right atrial pressure of 3 mmHg.    has a past medical history of Genital herpes, GERD (gastroesophageal reflux disease), Marijuana smoker, and Smoker.   reports that he has quit smoking. He  quit after 2.00 years of use. He has never used smokeless tobacco.  No past surgical history on file.  No Known Allergies  Immunization History  Administered Date(s) Administered  . Tdap 12/26/2014    Family History  Problem Relation Age of Onset  . Hypertension Father   . Diabetes Father   . Diabetes Brother      Current Outpatient Medications:  .  valACYclovir (VALTREX) 500 MG tablet, Take 1 tablet (500 mg total) by mouth daily., Disp: 30 tablet, Rfl: 5      Objective:   There were no vitals filed for this visit.  Estimated body mass index is 37.28 kg/m as calculated from the following:   Height as of 03/06/19: 5\' 10"  (1.778 m).   Weight as of 03/06/19: 259 lb 12.8 oz (117.8 kg).  @WEIGHTCHANGE @  There were no vitals filed for this visit.   Physical Exam  Sounded normal     Assessment:       ICD-10-CM   1. Dyspnea on exertion  R06.00   2. History of 2019 novel coronavirus disease (COVID-19)  Z86.16        Plan:     Patient Instructions     ICD-10-CM   1. Dyspnea on exertion  R06.00   2. History of 2019 novel coronavirus disease (COVID-19)  Z86.16    Normal Echo and CT  Plan Focous on general health - weight loss, exercise and followup with PCP 2020, DO regarding snoring  Followup Expectant followup     SIGNATURE    Dr. 2020, M.D., F.C.C.P,  Pulmonary and Critical Care Medicine Staff Physician, [facility] Center Director - Interstitial Lung Disease  Program  Pulmonary Fibrosis [facility], [facility], [place]  Pager: [phone], If no answer or between  15:00h - 7:00h: call [phone] Telephone: [phone]  9:45 AM 04/21/2019

## 2019-04-21 NOTE — Patient Instructions (Addendum)
ICD-10-CM   1. Dyspnea on exertion  R06.00   2. History of 2019 novel coronavirus disease (COVID-19)  Z86.16    Normal Echo and CT  Plan Focous on general health - weight loss, exercise and followup with PCP Randall Reichmann, DO regarding snoring  Followup Expectant followup
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
## 2021-11-03 ENCOUNTER — Ambulatory Visit (INDEPENDENT_AMBULATORY_CARE_PROVIDER_SITE_OTHER): Payer: 59

## 2021-11-03 ENCOUNTER — Other Ambulatory Visit: Payer: Self-pay

## 2021-11-03 ENCOUNTER — Ambulatory Visit
Admission: EM | Admit: 2021-11-03 | Discharge: 2021-11-03 | Disposition: A | Discharge status: Home / Self Care | Payer: 59 | Attending: Emergency Medicine | Admitting: Emergency Medicine

## 2021-11-03 DIAGNOSIS — S96912A Strain of unspecified muscle and tendon at ankle and foot level, left foot, initial encounter: Secondary | ICD-10-CM

## 2021-11-03 DIAGNOSIS — S93402A Sprain of unspecified ligament of left ankle, initial encounter: Secondary | ICD-10-CM

## 2021-11-03 DIAGNOSIS — M25572 Pain in left ankle and joints of left foot: Secondary | ICD-10-CM

## 2021-11-03 MED ORDER — IBUPROFEN 600 MG PO TABS: 600.0000 mg | ORAL_TABLET | Freq: Three times a day (TID) | ORAL | 0 refills | Status: DC | PRN

## 2021-11-03 NOTE — Discharge Instructions (Signed)
I have enclosed some information about ankle sprains and rehab that I hope you find helpful.  Please use the crutches as much as you can to avoid bearing weight on your ankle for the next 7 to 10 days.  Once your ankle is feeling more stable, please be sure that you are always wearing the stabilizing ankle brace that we provided for you when you attempted to walk.  Please do not walk any more than you have to.  I sent a prescription for ibuprofen 600 mg to your pharmacy that you can take 3 times daily as needed for pain.  I am able to write you a note to be out of work for the next 3 business days.  Please be sure that you follow-up with occupational health for further evaluation and recommendations for limitations, if any.  Thank you for visiting urgent care today.

## 2021-11-03 NOTE — ED Notes (Signed)
Awaiting new information for patients job to be placed in chart

## 2021-11-03 NOTE — ED Triage Notes (Addendum)
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 The patient now has Left sided ankle pain. The patient is not able to bare much weight to the extremity and is using crutches while ambulating.   Happened: 12pm-1pm today
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
## 2022-05-07 ENCOUNTER — Emergency Department (HOSPITAL_COMMUNITY): Payer: Self-pay

## 2022-05-07 ENCOUNTER — Encounter (HOSPITAL_COMMUNITY): Payer: Self-pay

## 2022-05-07 ENCOUNTER — Emergency Department (HOSPITAL_COMMUNITY)
Admission: EM | Admit: 2022-05-07 | Discharge: 2022-05-07 | Disposition: A | Discharge status: Home / Self Care | Payer: Self-pay | Attending: Emergency Medicine | Admitting: Emergency Medicine

## 2022-05-07 ENCOUNTER — Other Ambulatory Visit: Payer: Self-pay

## 2022-05-07 DIAGNOSIS — N179 Acute kidney failure, unspecified: Secondary | ICD-10-CM | POA: Insufficient documentation

## 2022-05-07 DIAGNOSIS — D72829 Elevated white blood cell count, unspecified: Secondary | ICD-10-CM | POA: Insufficient documentation

## 2022-05-07 DIAGNOSIS — R519 Headache, unspecified: Secondary | ICD-10-CM | POA: Insufficient documentation

## 2022-05-07 LAB — URINALYSIS, ROUTINE W REFLEX MICROSCOPIC
Bilirubin Urine: NEGATIVE
Glucose, UA: NEGATIVE mg/dL
Hgb urine dipstick: NEGATIVE
Ketones, ur: NEGATIVE mg/dL
Leukocytes,Ua: NEGATIVE
Nitrite: NEGATIVE
Protein, ur: NEGATIVE mg/dL
Specific Gravity, Urine: 1.012 (ref 1.005–1.030)
pH: 7 (ref 5.0–8.0)

## 2022-05-07 LAB — CBC WITH DIFFERENTIAL/PLATELET
Abs Immature Granulocytes: 0.06 10*3/uL (ref 0.00–0.07)
Basophils Absolute: 0 10*3/uL (ref 0.0–0.1)
Basophils Relative: 0 %
Eosinophils Absolute: 0 10*3/uL (ref 0.0–0.5)
Eosinophils Relative: 0 %
HCT: 45.7 % (ref 39.0–52.0)
Hemoglobin: 15.8 g/dL (ref 13.0–17.0)
Immature Granulocytes: 0 %
Lymphocytes Relative: 12 %
Lymphs Abs: 1.7 10*3/uL (ref 0.7–4.0)
MCH: 29.3 pg (ref 26.0–34.0)
MCHC: 34.6 g/dL (ref 30.0–36.0)
MCV: 84.6 fL (ref 80.0–100.0)
Monocytes Absolute: 0.8 10*3/uL (ref 0.1–1.0)
Monocytes Relative: 5 %
Neutro Abs: 11.7 10*3/uL — ABNORMAL HIGH (ref 1.7–7.7)
Neutrophils Relative %: 83 %
Platelets: 359 10*3/uL (ref 150–400)
RBC: 5.4 MIL/uL (ref 4.22–5.81)
RDW: 12 % (ref 11.5–15.5)
WBC: 14.3 10*3/uL — ABNORMAL HIGH (ref 4.0–10.5)
nRBC: 0 % (ref 0.0–0.2)

## 2022-05-07 LAB — RAPID URINE DRUG SCREEN, HOSP PERFORMED
Amphetamines: NOT DETECTED
Barbiturates: NOT DETECTED
Benzodiazepines: NOT DETECTED
Cocaine: NOT DETECTED
Opiates: NOT DETECTED
Tetrahydrocannabinol: NOT DETECTED

## 2022-05-07 LAB — BASIC METABOLIC PANEL
Anion gap: 12 (ref 5–15)
BUN: 10 mg/dL (ref 6–20)
CO2: 20 mmol/L — ABNORMAL LOW (ref 22–32)
Calcium: 9.9 mg/dL (ref 8.9–10.3)
Chloride: 101 mmol/L (ref 98–111)
Creatinine, Ser: 1.32 mg/dL — ABNORMAL HIGH (ref 0.61–1.24)
GFR, Estimated: >60 mL/min (ref >60)
Glucose, Bld: 116 mg/dL — ABNORMAL HIGH (ref 70–99)
Potassium: 4 mmol/L (ref 3.5–5.1)
Sodium: 133 mmol/L — ABNORMAL LOW (ref 135–145)

## 2022-05-07 MED ORDER — METOCLOPRAMIDE HCL 5 MG/ML IJ SOLN
10.0000 mg | Freq: Once | INTRAMUSCULAR | Status: AC
  Administered 2022-05-07: 10 mg via INTRAVENOUS
  Filled 2022-05-07: qty 2

## 2022-05-07 MED ORDER — FENTANYL CITRATE PF 50 MCG/ML IJ SOSY
50.0000 ug | PREFILLED_SYRINGE | Freq: Once | INTRAMUSCULAR | Status: AC
  Administered 2022-05-07: 50 ug via INTRAVENOUS
  Filled 2022-05-07: qty 1

## 2022-05-07 MED ORDER — SODIUM CHLORIDE 0.9 % IV BOLUS
1000.0000 mL | Freq: Once | INTRAVENOUS | Status: AC
  Administered 2022-05-07: 1000 mL via INTRAVENOUS

## 2022-05-07 MED ORDER — GADOBUTROL 1 MMOL/ML IV SOLN
10.0000 mL | Freq: Once | INTRAVENOUS | Status: AC | PRN
  Administered 2022-05-07: 10 mL via INTRAVENOUS

## 2022-05-07 MED ORDER — KETOROLAC TROMETHAMINE 30 MG/ML IJ SOLN
30.0000 mg | Freq: Once | INTRAMUSCULAR | Status: AC
  Administered 2022-05-07: 30 mg via INTRAVENOUS
  Filled 2022-05-07: qty 1

## 2022-05-07 NOTE — ED Provider Notes (Signed)
[place] [facility]GENC[facility] DE[facility]MENT AT [facility] Provider Note   CSN: [id number] Arrival date & time: 05/07/22  0801     History  Chief Complaint  Patient presents with   Headache    Randall Rodriguez is a 37 y.o. male.  He is here with a complaint of headache that started 10 AM yesterday.  He cannot really describe the onset of the headache, cannot tell me if it was severe at onset or sudden.  He has had it since then.  Initially was associated with little bit of abdominal discomfort but that resolved.  He said his wife was concerned because he was only answering her with one-word answers because of his head discomfort.  She was concerned he may be having a stroke and so called the ambulance.  His initial stroke screen was negative with EMS.  He had a temperature of 99.8.  He denies any blurry vision double vision chest pain shortness of breath nausea vomiting diarrhea numbness or weakness.  He said he had a headache like this 1 time before and he was dehydrated.  The history is provided by the patient.  Headache Pain location:  Frontal Quality:  Dull Severity currently:  10/10 Severity at highest:  10/10 Onset quality:  Unable to specify Duration:  22 hours Timing:  Constant Progression:  Unchanged Chronicity:  New Relieved by:  Nothing Worsened by:  Nothing Ineffective treatments:  None tried Associated symptoms: abdominal pain and fever   Associated symptoms: no blurred vision, no cough, no diarrhea, no hearing loss, no loss of balance, no nausea, no neck pain, no neck stiffness, no numbness, no photophobia, no visual change, no vomiting and no weakness        Home Medications Prior to Admission medications   Medication Sig Start Date End Date Taking? Authorizing Provider  ibuprofen (ADVIL) 600 MG tablet Take 1 tablet (600 mg total) by mouth every 8 (eight) hours as needed for up to 30 doses for fever, headache, mild pain or moderate pain (Inflammation). Take 1 tablet 3  times daily as needed for inflammation of upper airways and/or pain. 11/03/21   Theadora Rama Scales, PA-C  valACYclovir (VALTREX) 500 MG tablet Take 1 tablet (500 mg total) by mouth daily. 02/05/16   Wallis Bamberg, PA-C      Allergies    Patient has no known allergies.    Review of Systems   Review of Systems  Constitutional:  Positive for fever.  HENT:  Negative for hearing loss.   Eyes:  Negative for blurred vision and photophobia.  Respiratory:  Negative for cough.   Gastrointestinal:  Positive for abdominal pain. Negative for diarrhea, nausea and vomiting.  Musculoskeletal:  Negative for neck pain and neck stiffness.  Neurological:  Positive for headaches. Negative for weakness, numbness and loss of balance.    Physical Exam Updated Vital Signs BP (!) 144/80 (BP Location: Right Arm)   Pulse 89   Temp 99.1 F (37.3 C) (Oral)   Resp 18   Ht 5\' 10"  (1.778 m)   Wt 117.8 kg   SpO2 100%   BMI 37.26 kg/m  Physical Exam Vitals and nursing note reviewed.  Constitutional:      General: He is not in acute distress.    Appearance: Normal appearance. He is well-developed.  HENT:     Head: Normocephalic and atraumatic.  Eyes:     Conjunctiva/sclera: Conjunctivae normal.  Neck:     Meningeal: Brudzinski's sign and Kernig's sign absent.  Cardiovascular:     Rate and Rhythm: Normal rate and regular rhythm.     Heart sounds: No murmur heard. Pulmonary:     Effort: Pulmonary effort is normal. No respiratory distress.     Breath sounds: Normal breath sounds.  Abdominal:     Palpations: Abdomen is soft.     Tenderness: There is no abdominal tenderness. There is no guarding or rebound.  Musculoskeletal:        General: No deformity. Normal range of motion.     Cervical back: Neck supple. No rigidity.  Skin:    General: Skin is warm and dry.     Capillary Refill: Capillary refill takes less than 2 seconds.  Neurological:     Mental Status: He is alert.     GCS: GCS eye subscore  is 4. GCS verbal subscore is 5. GCS motor subscore is 6.     Cranial Nerves: No cranial nerve deficit, dysarthria or facial asymmetry.     Sensory: No sensory deficit.     Motor: No weakness.     Gait: Gait normal.     ED Results / Procedures / Treatments   Labs (all labs ordered are listed, but only abnormal results are displayed) Labs Reviewed  BASIC METABOLIC PANEL - Abnormal; Notable for the following components:      Result Value   Sodium 133 (*)    CO2 20 (*)    Glucose, Bld 116 (*)    Creatinine, Ser 1.32 (*)    All other components within normal limits  CBC WITH DIFFERENTIAL/PLATELET - Abnormal; Notable for the following components:   WBC 14.3 (*)    Neutro Abs 11.7 (*)    All other components within normal limits  URINALYSIS, ROUTINE W REFLEX MICROSCOPIC  RAPID URINE DRUG SCREEN, HOSP PERFORMED    EKG EKG Interpretation  Date/Time:  Thursday May 07 2022 08:11:41 EDT Ventricular Rate:  93 PR Interval:  171 QRS Duration: 94 QT Interval:  325 QTC Calculation: 405 R Axis:   3 Text Interpretation: Sinus rhythm increased rate from prior 10/18 Confirmed by Meridee Score [id number]) on 05/07/2022 8:13:35 AM  Radiology MR Brain W and Wo Contrast  Result Date: 05/07/2022 CLINICAL DATA:  Mental status change, unknown cause Headache, increasing frequency or severity EXAM: MRI HEAD WITHOUT AND WITH CONTRAST TECHNIQUE: Multiplanar, multiecho pulse sequences of the brain and surrounding structures were obtained without and with intravenous contrast. CONTRAST:  10mL GADAVIST GADOBUTROL 1 MMOL/ML IV SOLN COMPARISON:  CT head from the same day. FINDINGS: Brain: No acute infarction, hemorrhage, hydrocephalus, extra-axial collection or mass lesion. No pathologic enhancement. Vascular: Major arterial flow voids are maintained at the skull base. Skull and upper cervical spine: Normal marrow signal. Sinuses/Orbits: Largely clear sinuses.  No acute orbital findings. Other: No mastoid effusions.  IMPRESSION: Normal brain MRI.  No evidence of acute intracranial abnormality. Electronically Signed   By: Feliberto Harts M.D.   On: 05/07/2022 10:38   CT Head Wo Contrast  Result Date: 05/07/2022 CLINICAL DATA:  Headache, sudden, severe EXAM: CT HEAD WITHOUT CONTRAST TECHNIQUE: Contiguous axial images were obtained from the base of the skull through the vertex without intravenous contrast. RADIATION DOSE REDUCTION: This exam was performed according to the departmental dose-optimization program which includes automated exposure control, adjustment of the mA and/or kV according to patient size and/or use of iterative reconstruction technique. COMPARISON:  None Available. FINDINGS: Brain: No evidence of acute infarction, hemorrhage, hydrocephalus, extra-axial collection or mass lesion/mass effect. Vascular:  No hyperdense vessel. Skull: No acute fracture. Sinuses/Orbits: Largely clear sinuses.  No acute orbital findings. Other: No mastoid effusions. IMPRESSION: No evidence of acute intracranial abnormality. Electronically Signed   By: Feliberto Harts M.D.   On: 05/07/2022 08:52    Procedures Procedures    Medications Ordered in ED Medications  sodium chloride 0.9 % bolus 1,000 mL (0 mLs Intravenous Stopped 05/07/22 1052)  fentaNYL (SUBLIMAZE) injection 50 mcg (50 mcg Intravenous Given 05/07/22 0836)  metoCLOPramide (REGLAN) injection 10 mg (10 mg Intravenous Given 05/07/22 0836)  gadobutrol (GADAVIST) 1 MMOL/ML injection 10 mL (10 mLs Intravenous Contrast Given 05/07/22 1012)  ketorolac (TORADOL) 30 MG/ML injection 30 mg (30 mg Intravenous Given 05/07/22 1052)  sodium chloride 0.9 % bolus 1,000 mL (0 mLs Intravenous Stopped 05/07/22 1244)    ED Course/ Medical Decision Making/ A&P Clinical Course as of 05/07/22 1740  Thu May 07, 2022  0844 Wife is here now and states he has not been acting himself since returning from work last evening.  She said he needed to sleep and could not help take care of the  kids.  Woke up and had dinner cold which was unusual for him.  Today he just could not seem to focus.  She thought his balance seemed off when he was walking with EMS. [MB]  1043 States he is feeling little bit better.  MRI negative for any acute findings.  Will give him some Toradol and more fluids and reassess. [MB]  1221 Patient states his headache is improving and his wife feels he is at his baseline.  He said he would like to go home and rest and I think this is reasonable.  Asked him to keep an eye out on his symptoms.  Return instructions discussed. [MB]    Clinical Course User Index [MB] Terrilee Files, MD                             Medical Decision Making Amount and/or Complexity of Data Reviewed Labs: ordered. Radiology: ordered.  Risk Prescription drug management.   This patient complains of frontal headache possible mental status change; this involves an extensive number of treatment Options and is a complaint that carries with it a high risk of complications and morbidity. The differential includes tumor, mass, bleed, migraine, headache, dehydration  I ordered, reviewed and interpreted labs, which included CBC with elevated white count stable hemoglobin, chemistries with some mild AKI, urinalysis negative, talk screen negative I ordered medication IV fluids migraine cocktail and reviewed PMP when indicated. I ordered imaging studies which included CT head, MRI brain and I independently    visualized and interpreted imaging which showed no acute findings Additional history obtained from patient's wife Previous records obtained and reviewed in epic no recent admissions Cardiac monitoring reviewed, normal sinus rhythm Social determinants considered, no significant barriers Critical Interventions: None  After the interventions stated above, I reevaluated the patient and found patient to be awake alert neuro intact and headache improving Admission and further testing  considered, no indications for admission or further workup at this time.  Recommended close follow-up with PCP and return instructions discussed.         Final Clinical Impression(s) / ED Diagnoses Final diagnoses:  Generalized headache    Rx / DC Orders ED Discharge Orders     None         Terrilee Files, MD 05/07/22 1742

## 2022-05-07 NOTE — Discharge Instructions (Addendum)
You were seen in the emergency department for headache.  You had lab work CAT scan and MRI that did not show a definite explanation for your symptoms.  Please rest and stay well-hydrated.  Follow-up with your regular doctor.  Return to the emergency department if any worsening or concerning symptoms.

## 2022-05-07 NOTE — ED Triage Notes (Signed)
EMS called for stroke like symptoms. Yesterday around 1000 had sudden onset headache with potential behavioral changes. Has been acting abnormally since then. Still has headache. Hx of migraines. Temperature is 99.8 per EMS. Pt given 650mg  tylenol by EMS. Patient reports lower back pain.

## 2022-05-11 ENCOUNTER — Ambulatory Visit
Admission: EM | Admit: 2022-05-11 | Discharge: 2022-05-11 | Disposition: A | Discharge status: Home / Self Care | Payer: Self-pay

## 2022-05-11 DIAGNOSIS — J019 Acute sinusitis, unspecified: Secondary | ICD-10-CM

## 2022-05-11 DIAGNOSIS — R519 Headache, unspecified: Secondary | ICD-10-CM

## 2022-05-11 MED ORDER — AMOXICILLIN-POT CLAVULANATE 875-125 MG PO TABS: 1.0000 | ORAL_TABLET | Freq: Two times a day (BID) | ORAL | 0 refills | Status: DC

## 2022-05-11 MED ORDER — CETIRIZINE HCL 10 MG PO TABS: 10.0000 mg | ORAL_TABLET | Freq: Every day | ORAL | 0 refills | Status: DC

## 2022-05-11 MED ORDER — PSEUDOEPHEDRINE HCL 60 MG PO TABS: 60.0000 mg | ORAL_TABLET | Freq: Three times a day (TID) | ORAL | 0 refills | Status: DC | PRN

## 2022-05-11 NOTE — ED Notes (Signed)
Called pt to return for COVID test, states he don't need it.

## 2022-05-11 NOTE — ED Triage Notes (Signed)
Pt reports headache since 05/06/22. Excedrin, Tylenol gives some relief.

## 2022-05-11 NOTE — ED Provider Notes (Signed)
[name]  Note:  This document was prepared using Conservation officer, historic buildings and may include unintentional dictation errors.  MRN: [id number] DOB: 09/03/1985  Subjective:   Randall Rodriguez is a 37 y.o. male presenting for 6 to 7-day history of persistent moderate to severe frontal headache with pressure around his eyes.  Patient went to the emergency room and had extensive workup done 05/07/2022.  MRI and head CT were negative.  Had a positive result of leukocytosis.  Patient would like to be checked for COVID.  Has been using Excedrin and Tylenol.  No smoking.  No alcohol use.  Denies fever, runny or stuffy nose, ear pain, weakness, numbness or tingling, neck stiffness, neck pain, cough, trouble with speech, facial droop.  No drug use.  Patient hydrates with 3-4 bottles of water daily.  Generally gets good sleep.  No current facility-administered medications for this encounter.  Current Outpatient Medications:    acetaminophen (TYLENOL) 325 MG suppository, Place 325 mg rectally every 4 (four) hours as needed., Disp: , Rfl:    aspirin-acetaminophen-caffeine (EXCEDRIN MIGRAINE) 250-250-65 MG tablet, Take by mouth every 6 (six) hours as needed for headache., Disp: , Rfl:    ibuprofen (ADVIL) 600 MG tablet, Take 1 tablet (600 mg total) by mouth every 8 (eight) hours as needed for up to 30 doses for fever, headache, mild pain or moderate pain (Inflammation). Take 1 tablet 3 times daily as needed for inflammation of upper airways and/or pain., Disp: 30 tablet, Rfl: 0   valACYclovir (VALTREX) 500 MG tablet, Take 1 tablet (500 mg total) by mouth daily., Disp: 30 tablet, Rfl: 5   No Known Allergies  Past Medical History:  Diagnosis Date   Genital herpes    GERD (gastroesophageal reflux disease)    Marijuana smoker    Smoker      History reviewed. No pertinent surgical history.  Family History  Problem Relation Age of Onset   Hypertension Father    Diabetes  Father    Diabetes Brother     Social History   Tobacco Use   Smoking status: Former    Years: 2    Types: Cigarettes   Smokeless tobacco: Never  Vaping Use   Vaping Use: Never used  Substance Use Topics   Alcohol use: Yes    Alcohol/week: 0.0 standard drinks of alcohol    Comment: Occasional beer,wine and liquor   Drug use: Not Currently    Frequency: 2.0 times per week    Types: Marijuana    Comment: marijuana    ROS   Objective:   Vitals: BP (!) 140/87 (BP Location: Right Arm)   Pulse 75   Temp 99 F (37.2 C) (Oral)   Resp 18   SpO2 98%   Physical Exam Constitutional:      General: He is not in acute distress.    Appearance: Normal appearance. He is well-developed and normal weight. He is not ill-appearing, toxic-appearing or diaphoretic.  HENT:     Head: Normocephalic and atraumatic.     Right Ear: Tympanic membrane, ear canal and external ear normal. No drainage, swelling or tenderness. No middle ear effusion. There is no impacted cerumen. Tympanic membrane is not erythematous or bulging.     Left Ear: Tympanic membrane, ear canal and external ear normal. No drainage, swelling or tenderness.  No middle ear effusion. There is no impacted cerumen. Tympanic membrane is not erythematous or bulging.     Nose: Nose normal. No  congestion or rhinorrhea.     Mouth/Throat:     Mouth: Mucous membranes are moist.     Pharynx: No oropharyngeal exudate or posterior oropharyngeal erythema.  Eyes:     General: No scleral icterus.       Right eye: No discharge.        Left eye: No discharge.     Extraocular Movements: Extraocular movements intact.     Conjunctiva/sclera: Conjunctivae normal.  Neck:     Meningeal: Brudzinski's sign and Kernig's sign absent.  Cardiovascular:     Rate and Rhythm: Normal rate and regular rhythm.     Heart sounds: Normal heart sounds. No murmur heard.    No friction rub. No gallop.  Pulmonary:     Effort: Pulmonary effort is normal. No  respiratory distress.     Breath sounds: Normal breath sounds. No stridor. No wheezing, rhonchi or rales.  Musculoskeletal:     Cervical back: Normal range of motion and neck supple. No swelling, edema, deformity, erythema, signs of trauma, lacerations, rigidity, spasms, torticollis, tenderness, bony tenderness or crepitus. No pain with movement or muscular tenderness. Normal range of motion.  Neurological:     General: No focal deficit present.     Mental Status: He is alert and oriented to person, place, and time.     Cranial Nerves: No cranial nerve deficit, dysarthria or facial asymmetry.     Motor: No weakness, abnormal muscle tone or pronator drift.     Coordination: Romberg sign negative. Coordination normal. Finger-Nose-Finger Test and Heel to [facility] Test normal. Rapid alternating movements normal.     Gait: Gait normal.     Deep Tendon Reflexes: Reflexes normal.  Psychiatric:        Mood and Affect: Mood normal.        Behavior: Behavior normal.        Thought Content: Thought content normal.        Judgment: Judgment normal.    MR Brain W and Wo Contrast  Result Date: 05/07/2022 CLINICAL DATA:  Mental status change, unknown cause Headache, increasing frequency or severity EXAM: MRI HEAD WITHOUT AND WITH CONTRAST TECHNIQUE: Multiplanar, multiecho pulse sequences of the brain and surrounding structures were obtained without and with intravenous contrast. CONTRAST:  10mL GADAVIST GADOBUTROL 1 MMOL/ML IV SOLN COMPARISON:  CT head from the same day. FINDINGS: Brain: No acute infarction, hemorrhage, hydrocephalus, extra-axial collection or mass lesion. No pathologic enhancement. Vascular: Major arterial flow voids are maintained at the skull base. Skull and upper cervical spine: Normal marrow signal. Sinuses/Orbits: Largely clear sinuses.  No acute orbital findings. Other: No mastoid effusions. IMPRESSION: Normal brain MRI.  No evidence of acute intracranial abnormality. Electronically Signed    By: Feliberto Harts M.D.   On: 05/07/2022 10:38   CT Head Wo Contrast  Result Date: 05/07/2022 CLINICAL DATA:  Headache, sudden, severe EXAM: CT HEAD WITHOUT CONTRAST TECHNIQUE: Contiguous axial images were obtained from the base of the skull through the vertex without intravenous contrast. RADIATION DOSE REDUCTION: This exam was performed according to the departmental dose-optimization program which includes automated exposure control, adjustment of the mA and/or kV according to patient size and/or use of iterative reconstruction technique. COMPARISON:  None Available. FINDINGS: Brain: No evidence of acute infarction, hemorrhage, hydrocephalus, extra-axial collection or mass lesion/mass effect. Vascular: No hyperdense vessel. Skull: No acute fracture. Sinuses/Orbits: Largely clear sinuses.  No acute orbital findings. Other: No mastoid effusions. IMPRESSION: No evidence of acute intracranial abnormality. Electronically Signed  By: Feliberto Harts M.D.   On: 05/07/2022 08:52    Recent Results (from the past 2160 hour(s))  Basic metabolic panel     Status: Abnormal   Collection Time: 05/07/22  8:28 AM  Result Value Ref Range   Sodium 133 (L) 135 - 145 mmol/L   Potassium 4.0 3.5 - 5.1 mmol/L   Chloride 101 98 - 111 mmol/L   CO2 20 (L) 22 - 32 mmol/L   Glucose, Bld 116 (H) 70 - 99 mg/dL    Comment: Glucose reference range applies only to samples taken after fasting for at least 8 hours.   BUN 10 6 - 20 mg/dL   Creatinine, Ser 1.61 (H) 0.61 - 1.24 mg/dL   Calcium 9.9 8.9 - 09.6 mg/dL   GFR, Estimated >04 >54 mL/min    Comment: (NOTE) Calculated using the CKD-EPI Creatinine Equation (2021)    Anion gap 12 5 - 15    Comment: Performed at [facility], [address], [place], [place] [address]  CBC with Differential     Status: Abnormal   Collection Time: 05/07/22  8:28 AM  Result Value Ref Range   WBC 14.3 (H) 4.0 - 10.5 K/uL   RBC 5.40 4.22 - 5.81 MIL/uL   Hemoglobin 15.8 13.0 -  17.0 g/dL   HCT 91.4 78.2 - 95.6 %   MCV 84.6 80.0 - 100.0 fL   MCH 29.3 26.0 - 34.0 pg   MCHC 34.6 30.0 - 36.0 g/dL   RDW 21.3 08.6 - 57.8 %   Platelets 359 150 - 400 K/uL   nRBC 0.0 0.0 - 0.2 %   Neutrophils Relative % 83 %   Neutro Abs 11.7 (H) 1.7 - 7.7 K/uL   Lymphocytes Relative 12 %   Lymphs Abs 1.7 0.7 - 4.0 K/uL   Monocytes Relative 5 %   Monocytes Absolute 0.8 0.1 - 1.0 K/uL   Eosinophils Relative 0 %   Eosinophils Absolute 0.0 0.0 - 0.5 K/uL   Basophils Relative 0 %   Basophils Absolute 0.0 0.0 - 0.1 K/uL   Immature Granulocytes 0 %   Abs Immature Granulocytes 0.06 0.00 - 0.07 K/uL    Comment: Performed at [facility], [address] [place], [place] [address]  Urinalysis, Routine w reflex microscopic -Urine, Clean Catch     Status: None   Collection Time: 05/07/22  8:42 AM  Result Value Ref Range   Color, Urine YELLOW YELLOW   APPearance CLEAR CLEAR   Specific Gravity, Urine 1.012 1.005 - 1.030   pH 7.0 5.0 - 8.0   Glucose, UA NEGATIVE NEGATIVE mg/dL   Hgb urine dipstick NEGATIVE NEGATIVE   Bilirubin Urine NEGATIVE NEGATIVE   Ketones, ur NEGATIVE NEGATIVE mg/dL   Protein, ur NEGATIVE NEGATIVE mg/dL   Nitrite NEGATIVE NEGATIVE   Leukocytes,Ua NEGATIVE NEGATIVE    Comment: Performed at [facility], [address] [place], [place] [address]  Urine rapid drug screen (hosp performed)     Status: None   Collection Time: 05/07/22  8:42 AM  Result Value Ref Range   Opiates NONE DETECTED NONE DETECTED   Cocaine NONE DETECTED NONE DETECTED   Benzodiazepines NONE DETECTED NONE DETECTED   Amphetamines NONE DETECTED NONE DETECTED   Tetrahydrocannabinol NONE DETECTED NONE DETECTED   Barbiturates NONE DETECTED NONE DETECTED    Comment: (NOTE) DRUG SCREEN FOR MEDICAL PURPOSES ONLY.  IF CONFIRMATION IS NEEDED FOR ANY PURPOSE, NOTIFY LAB WITHIN 5 DAYS.  LOWEST DETECTABLE LIMITS FOR URINE DRUG SCREEN Drug Class                     Cutoff  (ng/mL) Amphetamine and metabolites    1000 Barbiturate and metabolites    200 Benzodiazepine                 200 Opiates and metabolites        300 Cocaine and metabolites        300 THC                            50 Performed at [facility], [address], [place], [place] [address]     Assessment and Plan :   PDMP not reviewed this encounter.  1. Acute sinusitis, recurrence not specified, unspecified location   2. Sinus headache    Labs, imaging obtained from his emergency room visit, reviewed with the patient and his wife.  Suspect the headache is secondary to an infectious process given the leukocytosis.  No signs of an acute encephalopathy and therefore will defer repeat ER visit.  Will start empiric treatment for sinusitis with Augmentin.  Recommended supportive care otherwise. Counseled patient on potential for adverse effects with medications prescribed/recommended today, ER and return-to-clinic precautions discussed, patient verbalized understanding.    Wallis Bamberg, PA-C 05/11/22 1043

## 2022-05-14 ENCOUNTER — Emergency Department (HOSPITAL_COMMUNITY)
Admission: EM | Admit: 2022-05-14 | Discharge: 2022-05-14 | Disposition: A | Discharge status: Home / Self Care | Payer: Self-pay | Attending: Student in an Organized Health Care Education/Training Program | Admitting: Student in an Organized Health Care Education/Training Program

## 2022-05-14 ENCOUNTER — Other Ambulatory Visit: Payer: Self-pay

## 2022-05-14 DIAGNOSIS — G43919 Migraine, unspecified, intractable, without status migrainosus: Secondary | ICD-10-CM | POA: Insufficient documentation

## 2022-05-14 DIAGNOSIS — Z1152 Encounter for screening for COVID-19: Secondary | ICD-10-CM | POA: Insufficient documentation

## 2022-05-14 LAB — COMPREHENSIVE METABOLIC PANEL
ALT: 11 U/L (ref 0–44)
AST: 14 U/L — ABNORMAL LOW (ref 15–41)
Albumin: 4.1 g/dL (ref 3.5–5.0)
Alkaline Phosphatase: 65 U/L (ref 38–126)
Anion gap: 8 (ref 5–15)
BUN: 16 mg/dL (ref 6–20)
CO2: 25 mmol/L (ref 22–32)
Calcium: 9.6 mg/dL (ref 8.9–10.3)
Chloride: 104 mmol/L (ref 98–111)
Creatinine, Ser: 1.34 mg/dL — ABNORMAL HIGH (ref 0.61–1.24)
GFR, Estimated: >60 mL/min (ref >60)
Glucose, Bld: 96 mg/dL (ref 70–99)
Potassium: 4.4 mmol/L (ref 3.5–5.1)
Sodium: 137 mmol/L (ref 135–145)
Total Bilirubin: 0.4 mg/dL (ref 0.3–1.2)
Total Protein: 7.5 g/dL (ref 6.5–8.1)

## 2022-05-14 LAB — CBG MONITORING, ED: Glucose-Capillary: 104 mg/dL — ABNORMAL HIGH (ref 70–99)

## 2022-05-14 LAB — URINALYSIS, ROUTINE W REFLEX MICROSCOPIC
Bilirubin Urine: NEGATIVE
Glucose, UA: NEGATIVE mg/dL
Hgb urine dipstick: NEGATIVE
Ketones, ur: NEGATIVE mg/dL
Leukocytes,Ua: NEGATIVE
Nitrite: NEGATIVE
Protein, ur: NEGATIVE mg/dL
Specific Gravity, Urine: 1.011 (ref 1.005–1.030)
pH: 6 (ref 5.0–8.0)

## 2022-05-14 LAB — CBC WITH DIFFERENTIAL/PLATELET
Abs Immature Granulocytes: 0.05 10*3/uL (ref 0.00–0.07)
Basophils Absolute: 0.1 10*3/uL (ref 0.0–0.1)
Basophils Relative: 1 %
Eosinophils Absolute: 0.1 10*3/uL (ref 0.0–0.5)
Eosinophils Relative: 1 %
HCT: 44.9 % (ref 39.0–52.0)
Hemoglobin: 15.3 g/dL (ref 13.0–17.0)
Immature Granulocytes: 1 %
Lymphocytes Relative: 26 %
Lymphs Abs: 2.9 10*3/uL (ref 0.7–4.0)
MCH: 29.3 pg (ref 26.0–34.0)
MCHC: 34.1 g/dL (ref 30.0–36.0)
MCV: 85.9 fL (ref 80.0–100.0)
Monocytes Absolute: 0.9 10*3/uL (ref 0.1–1.0)
Monocytes Relative: 8 %
Neutro Abs: 7.1 10*3/uL (ref 1.7–7.7)
Neutrophils Relative %: 63 %
Platelets: 344 10*3/uL (ref 150–400)
RBC: 5.23 MIL/uL (ref 4.22–5.81)
RDW: 12.1 % (ref 11.5–15.5)
WBC: 11.1 10*3/uL — ABNORMAL HIGH (ref 4.0–10.5)
nRBC: 0 % (ref 0.0–0.2)

## 2022-05-14 LAB — RESP PANEL BY RT-PCR (RSV, FLU A&B, COVID)  RVPGX2
Influenza A by PCR: NEGATIVE
Influenza B by PCR: NEGATIVE
Resp Syncytial Virus by PCR: NEGATIVE
SARS Coronavirus 2 by RT PCR: NEGATIVE

## 2022-05-14 LAB — SEDIMENTATION RATE: Sed Rate: 3 mm/hr (ref 0–16)

## 2022-05-14 LAB — C-REACTIVE PROTEIN: CRP: <0.5 mg/dL (ref <1.0)

## 2022-05-14 MED ORDER — KETOROLAC TROMETHAMINE 15 MG/ML IJ SOLN
15.0000 mg | Freq: Once | INTRAMUSCULAR | Status: AC
  Administered 2022-05-14: 15 mg via INTRAVENOUS
  Filled 2022-05-14: qty 1

## 2022-05-14 MED ORDER — LACTATED RINGERS IV BOLUS
1000.0000 mL | Freq: Once | INTRAVENOUS | Status: AC
  Administered 2022-05-14: 1000 mL via INTRAVENOUS

## 2022-05-14 MED ORDER — METOCLOPRAMIDE HCL 10 MG PO TABS: 10.0000 mg | ORAL_TABLET | Freq: Four times a day (QID) | ORAL | 0 refills | Status: AC

## 2022-05-14 MED ORDER — DEXAMETHASONE SODIUM PHOSPHATE 10 MG/ML IJ SOLN
10.0000 mg | Freq: Once | INTRAMUSCULAR | Status: AC
  Administered 2022-05-14: 10 mg via INTRAVENOUS
  Filled 2022-05-14: qty 1

## 2022-05-14 NOTE — Discharge Instructions (Addendum)
I sent a referral to neurology to schedule appointment.  You should be hearing back from them within the next few days.  I have sent Reglan to the pharmacy to help your headaches and nausea.  I also recommend taking magnesium or Benadryl (both are over-the-counter) because these can help treat migraines.

## 2022-05-14 NOTE — ED Triage Notes (Addendum)
Pt reports headache x 7 days. States he was seen at [facility] 5/6 and was prescribed antibiotics, has had no relief.

## 2022-05-14 NOTE — ED Provider Notes (Signed)
[place] EMERGENCY DEPARTMENT AT [facility] Provider Note   CSN: [id number] Arrival date & time: 05/14/22  4098     History  Chief Complaint  Patient presents with   Headache    Randall Rodriguez is a 37 y.o. male.  37 year old male presenting to the emergency department c/o frontal headache x 1 week.  Wife at bedside states that the patient has had ongoing headache over the last 7 days that improves throughout the day but becomes significantly worse in the morning and at nighttime.  Wife states that the patient is noticeably weak and is having trouble performing activities of daily living due to his discomfort.  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  He denies any increased pressure with positional changes, fevers, blurry vision, neck pain, or vomiting.  He has been taking Excedrin which decreases the level of his headache down to a 3/10 throughout the day, however he is having to take Excedrin tablets throughout the day.  They deny any recent travel or new medications.  He denies any significant past medical history or surgical history.   Kadir was seen in the emergency department 1 week ago for evaluation of this new onset migraine.  He had complete workup including labs, head CT, and MRI brain which were overall unremarkable.  The labs that show some mild dehydration and he received 2 fluid boluses during his ER stay.  Patient was also seen at urgent care 4 days ago and prophylactically treated for acute sinusitis with Augmentin.  Wife states that he is on day 4 of antibiotics and has not had any improvement in his symptoms.  They deny any bowel or bladder incontinence, recent trauma, history of blood clots or family hx of clotting disorders, or jaw pain.   Headache      Home Medications Prior to Admission medications   Medication Sig Start Date End Date Taking? Authorizing Provider  metoCLOPramide (REGLAN) 10 MG tablet Take 1 tablet  (10 mg total) by mouth every 6 (six) hours. 05/14/22  Yes Jaydian Santana, DO  acetaminophen (TYLENOL) 325 MG suppository Place 325 mg rectally every 4 (four) hours as needed.    [provider]  amoxicillin-clavulanate (AUGMENTIN) 875-125 MG tablet Take 1 tablet by mouth 2 (two) times daily. 05/11/22   Wallis Bamberg, PA-C  aspirin-acetaminophen-caffeine (EXCEDRIN MIGRAINE) [phone] MG tablet Take by mouth every 6 (six) hours as needed for headache.    [provider]  cetirizine (ZYRTEC ALLERGY) 10 MG tablet Take 1 tablet (10 mg total) by mouth daily. 05/11/22   Wallis Bamberg, PA-C  ibuprofen (ADVIL) 600 MG tablet Take 1 tablet (600 mg total) by mouth every 8 (eight) hours as needed for up to 30 doses for fever, headache, mild pain or moderate pain (Inflammation). Take 1 tablet 3 times daily as needed for inflammation of upper airways and/or pain. 11/03/21   Theadora Rama Scales, PA-C  pseudoephedrine (SUDAFED) 60 MG tablet Take 1 tablet (60 mg total) by mouth every 8 (eight) hours as needed for congestion. 05/11/22   Wallis Bamberg, PA-C  valACYclovir (VALTREX) 500 MG tablet Take 1 tablet (500 mg total) by mouth daily. 02/05/16   Wallis Bamberg, PA-C      Allergies    Patient has no known allergies.    Review of Systems   Review of Systems  Neurological:  Positive for headaches.  All other systems reviewed and are negative.   Physical Exam Updated  Vital Signs BP 130/84   Pulse 73   Temp 98.6 F (37 C) (Oral)   Resp 18   Ht 5\' 10"  (1.778 m)   Wt 108.9 kg   SpO2 98%   BMI 34.44 kg/m  Physical Exam Vitals and nursing note reviewed.  Constitutional:      General: He is not in acute distress.    Appearance: He is well-developed.  HENT:     Head: Normocephalic and atraumatic.     Mouth/Throat:     Mouth: Mucous membranes are moist.  Eyes:     Extraocular Movements: Extraocular movements intact.     Conjunctiva/sclera: Conjunctivae normal.     Pupils: Pupils are equal, round,  and reactive to light.  Cardiovascular:     Rate and Rhythm: Normal rate and regular rhythm.     Heart sounds: No murmur heard. Pulmonary:     Effort: Pulmonary effort is normal. No respiratory distress.     Breath sounds: Normal breath sounds.  Abdominal:     Palpations: Abdomen is soft.     Tenderness: There is no abdominal tenderness.  Musculoskeletal:        General: No swelling.     Cervical back: Normal range of motion and neck supple.  Skin:    General: Skin is warm and dry.     Capillary Refill: Capillary refill takes less than 2 seconds.  Neurological:     Mental Status: He is alert and oriented to person, place, and time.     GCS: GCS eye subscore is 4. GCS verbal subscore is 5. GCS motor subscore is 6.     Cranial Nerves: No cranial nerve deficit, dysarthria or facial asymmetry.     Sensory: No sensory deficit.     Motor: No weakness.  Psychiatric:        Mood and Affect: Mood normal.     ED Results / Procedures / Treatments   Labs (all labs ordered are listed, but only abnormal results are displayed) Labs Reviewed  COMPREHENSIVE METABOLIC PANEL - Abnormal; Notable for the following components:      Result Value   Creatinine, Ser 1.34 (*)    AST 14 (*)    All other components within normal limits  CBC WITH DIFFERENTIAL/PLATELET - Abnormal; Notable for the following components:   WBC 11.1 (*)    All other components within normal limits  URINALYSIS, ROUTINE W REFLEX MICROSCOPIC - Abnormal; Notable for the following components:   Color, Urine STRAW (*)    All other components within normal limits  CBG MONITORING, ED - Abnormal; Notable for the following components:   Glucose-Capillary 104 (*)    All other components within normal limits  RESP PANEL BY RT-PCR (RSV, FLU A&B, COVID)  RVPGX2  C-REACTIVE PROTEIN  SEDIMENTATION RATE    EKG None  Radiology No results found.  Procedures Procedures    Medications Ordered in ED Medications  lactated  ringers bolus 1,000 mL (0 mLs Intravenous Stopped 05/14/22 1236)  dexamethasone (DECADRON) injection 10 mg (10 mg Intravenous Given 05/14/22 1035)  ketorolac (TORADOL) 15 MG/ML injection 15 mg (15 mg Intravenous Given 05/14/22 1035)    ED Course/ Medical Decision Making/ A&P                             Medical Decision Making 37 year old male presenting for evaluation of his ongoing frontal migraine headache for the last week.  He has had  extensive workup including CT of the head and MRI performed.  Differential includes tension headache, temporal arteritis, dehydration, and others.  There has not been a identified cause of his headaches, however family is concerned due to the severity of his symptoms.  He appears fatigued and wife reports generalized weakness.  He has been unable to work due to his symptoms.  Neuroexam is unremarkable and no focal changes identified.  We are repeating blood work and adding on inflammatory markers as well.  IV fluids and pain control given during this evaluation.  We are not going to repeat the CT scan or MRI since these were recent and he has had no significant changes in his condition. Patient's lab work including inflammatory markers are negative.  Patient reports that his headache resolved after the Toradol, IV fluids, and Decadron.  Neuroexam remained stable at this time.  Will give him neurology follow-up.  Will also give him some recommendations on medications at home to take if his headache returns.  Strict return precautions given.  Amount and/or Complexity of Data Reviewed Labs: ordered.  Risk Prescription drug management.          Final Clinical Impression(s) / ED Diagnoses Final diagnoses:  Intractable migraine without status migrainosus, unspecified migraine type    Rx / DC Orders ED Discharge Orders          Ordered    metoCLOPramide (REGLAN) 10 MG tablet  Every 6 hours        05/14/22 1240    Ambulatory referral to Neurology        Comments: An appointment is requested in approximately: 1 week   05/14/22 1243              Charvis Lightner, DO 05/14/22 1245

## 2022-05-14 NOTE — ED Notes (Signed)
Pt and family member provided drinks and snacks

## 2022-05-18 ENCOUNTER — Ambulatory Visit: Payer: Self-pay | Admitting: Neurology

## 2022-05-18 ENCOUNTER — Encounter: Payer: Self-pay | Admitting: Neurology

## 2022-05-18 VITALS — BP 124/85 | HR 87 | Ht 70.0 in | Wt 237.0 lb

## 2022-05-18 DIAGNOSIS — Z9189 Other specified personal risk factors, not elsewhere classified: Secondary | ICD-10-CM

## 2022-05-18 DIAGNOSIS — R519 Headache, unspecified: Secondary | ICD-10-CM

## 2022-05-18 DIAGNOSIS — E669 Obesity, unspecified: Secondary | ICD-10-CM

## 2022-05-18 DIAGNOSIS — R0683 Snoring: Secondary | ICD-10-CM

## 2022-05-18 NOTE — Progress Notes (Signed)
Subjective:    Patient ID: Randall Rodriguez is a 37 y.o. male.  HPI    Randall Foley, MD, PhD [facility] [address], [address] [address] [place], [place] [address]  I saw patient, Randall Rodriguez, as a referral from the ED for evaluation of recurrent headaches.  The patient is unaccompanied today.  Randall Rodriguez is a 37 year old male with an underlying medical history of reflux disease, and obesity, who reports recurrent headaches for the past 12 days.  Headache is slightly better, he feels that the steroid in the emergency room last week helped the most.  He wakes up with a headache, he denies a throbbing sensation or nausea or vomiting or photophobia.  He does have a bandlike or aching sensation in the bifrontal area.  He has no prior history of migraine-like headaches, never had any migraine medication.  He has been on Excedrin Migraine over-the-counter, takes about 2 tablets twice daily, last dose yesterday and has been taking Tylenol which did not seem to help.  He snores, he has never had a sleep study, he has a history of prescription eyeglasses and is overdue for his eye exam, last exam was about a year and a half ago and his prescription had changed at the time.  He has a history of elevated blood pressure values but has not been on blood pressure medication and is about 2 months overdue for his physical.  He has not seen his PCP since his headache started.  He denies any sudden onset of one-sided weakness or numbness or tingling or droopy face or slurring of speech.  Some neck discomfort but no radiating pain. He denies any significant visual disturbance but feels like his prescription may need to change.  He denies any loss of vision.  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  He does not drink caffeine daily.  He is a non-smoker of cigarettes and stopped smoking marijuana about 4 to 5  months ago.  He drinks alcohol occasionally, maybe once or twice a month.  He denies nocturia but has woken up with a headache.  He presented to the emergency room on 05/07/2022 with a headache.  I reviewed the emergency room records.  He was treated symptomatically with metoclopramide, fentanyl, ketorolac and IV fluids.  He had a head CT without contrast through [facility] ED on 05/07/2022 and I reviewed the results: Impression: No evidence of acute intracranial abnormality.   He had a brain MRI through [facility] emergency room on 05/07/2022 and I reviewed the results: Impression: Normal brain MRI.  No evidence of acute intracranial abnormality.    Laboratory testing included UDS on 05/07/2022 which was negative.  Urinalysis was negative for infection, CMP showed below normal sodium at 133, glucose 116, creatinine 1.32.  He went to the emergency room on 05/11/2022 with a frontal headache of approximately 1 weeks duration.  He was treated for sinusitis with Augmentin.  He went to the emergency room on 05/14/2022 with a persistent headache.  He reported a worsening headache in the mornings and at night, he was treated symptomatically with dexamethasone 10 mg, Toradol 15 mg, IV fluids.  Laboratory testing showed a negative urinalysis, CMP showed benign findings with the exception of creatinine of 1.34, AST 14 which is below normal.  ESR on 05/14/2022 was 3, WBC was 11.1, COVID test negative, influenza a and B by  PCR negative, RSV negative by PCR, CRP less than 0.5.     His Past Medical History Is Significant For: Past Medical History:  Diagnosis Date   Genital herpes    GERD (gastroesophageal reflux disease)    Marijuana smoker    Smoker     His Past Surgical History Is Significant For: History reviewed. No pertinent surgical history.  His Family History Is Significant For: Family History  Problem Relation Age of Onset   Hypertension Father    Diabetes Father    Diabetes Brother     His  Social History Is Significant For: Social History   Socioeconomic History   Marital status: Married    Spouse name: Not on file   Number of children: 2   Years of education: 11    Highest education level: Not on file  Occupational History   Occupation: Therapist, sports: [employer]  Tobacco Use   Smoking status: Former    Types: Cigars   Smokeless tobacco: Never   Tobacco comments:    Around [age] years old smoked black and mild for about a year or two   Vaping Use   Vaping Use: Former   Substances: THC, CBD  Substance and Sexual Activity   Alcohol use: Yes    Comment: Occasional beer,wine and liquor; socially   Drug use: Not Currently    Frequency: 2.0 times per week    Types: Marijuana    Comment: marijuana rarely, can go weeks or months without   Sexual activity: Yes  Other Topics Concern   Not on file  Social History Narrative   Daughter 5 yrs old    Son 3 yrs old    Caffeine: none    Social Determinants of Health   Financial Resource Strain: Not on file  Food Insecurity: Not on file  Transportation Needs: Not on file  Physical Activity: Not on file  Stress: Not on file  Social Connections: Not on file    His Allergies Are:  No Known Allergies:   His Current Medications Are:  Outpatient Encounter Medications as of 05/18/2022  Medication Sig   Acetaminophen (TYLENOL PO) Take 1,000 mg by mouth as needed.   aspirin-acetaminophen-caffeine (EXCEDRIN MIGRAINE) 250-250-65 MG tablet Take by mouth every 6 (six) hours as needed for headache.   metoCLOPramide (REGLAN) 10 MG tablet Take 1 tablet (10 mg total) by mouth every 6 (six) hours.   amoxicillin-clavulanate (AUGMENTIN) 875-125 MG tablet Take 1 tablet by mouth 2 (two) times daily. (Patient not taking: Reported on 05/18/2022)   cetirizine (ZYRTEC ALLERGY) 10 MG tablet Take 1 tablet (10 mg total) by mouth daily. (Patient not taking: Reported on 05/18/2022)   pseudoephedrine (SUDAFED) 60 MG tablet Take  1 tablet (60 mg total) by mouth every 8 (eight) hours as needed for congestion. (Patient not taking: Reported on 05/18/2022)   valACYclovir (VALTREX) 500 MG tablet Take 1 tablet (500 mg total) by mouth daily. (Patient not taking: Reported on 05/18/2022)   [DISCONTINUED] acetaminophen (TYLENOL) 325 MG suppository Place 325 mg rectally every 4 (four) hours as needed. (Patient not taking: Reported on 05/18/2022)   [DISCONTINUED] ibuprofen (ADVIL) 600 MG tablet Take 1 tablet (600 mg total) by mouth every 8 (eight) hours as needed for up to 30 doses for fever, headache, mild pain or moderate pain (Inflammation). Take 1 tablet 3 times daily as needed for inflammation of upper airways and/or pain. (Patient not taking: Reported on 05/18/2022)   No facility-administered encounter medications  on file as of 05/18/2022.  :   Review of Systems:  Out of a complete 14 point review of systems, all are reviewed and negative with the exception of these symptoms as listed below:  Review of Systems  Neurological:        Patient is here alone for headache referral. He states the headache started on a Wednesday morning almost 2 weeks ago and it became a very severe headache. He was taken by ambulance to ER, testing came back negative for stroke, and he states his BP was ok. He was given fluids and medication for the headache. The following Monday he went to urgent care. He was told he had a sinus infection and was given an antibiotic (complete) and Zyrtec. His headache persisted and this past Thursday he went to [facility]. He states testing still came back ok. He was referred here, given an IV steroid (headache went away for 3 days) and then it came back. The pain is across the front of his head. The last few days he has been waking up with some dizziness. He states every 1-2 years he gets vertigo for about a week and takes OTC medication to help and this been going on for the last 6-7 years. He wonders if he has issues with  his inner ear. At the beginning his eyeballs were hurting but now they are much better. He also reports lower back pain and a little bit of posterior neck discomfort.     Objective:  Neurological Exam  Physical Exam Physical Examination:   Vitals:   05/18/22 1432  BP: 124/85  Pulse: 87    General Examination: The patient is a very pleasant 37 y.o. male in no acute distress. He appears well-developed and well-nourished and well groomed.   HEENT: Normocephalic, atraumatic, pupils are equal, round and reactive to light, corrective eyeglasses in place, no photophobia.  Funduscopic exam benign.  Extraocular tracking is good without limitation to gaze excursion or nystagmus noted. Hearing is grossly intact. Face is symmetric with normal facial animation. Speech is clear with no dysarthria noted. There is no hypophonia. There is no lip, neck/head, jaw or voice tremor. Neck is supple with full range of passive and active motion. There are no carotid bruits on auscultation. Oropharynx exam reveals: mild mouth dryness, adequate dental hygiene and moderate airway crowding, due to small airway entry and redundant soft palate, wider uvula, tonsillar size of about 1+, Mallampati class II, neck circumference 17 inches.  Tongue protrudes centrally and palate elevates symmetrically.  Chest: Clear to auscultation without wheezing, rhonchi or crackles noted.  Heart: S1+S2+0, regular and normal without murmurs, rubs or gallops noted.   Abdomen: Soft, non-tender and non-distended.  Extremities: There is no pitting edema in the distal lower extremities bilaterally.   Skin: Warm and dry without trophic changes noted.   Musculoskeletal: exam reveals no obvious joint deformities.   Neurologically:  Mental status: The patient is awake, alert and oriented in all 4 spheres. His immediate and remote memory, attention, language skills and fund of knowledge are appropriate. There is no evidence of aphasia, agnosia,  apraxia or anomia. Speech is clear with normal prosody and enunciation. Thought process is linear. Mood is normal and affect is normal.  Cranial nerves II - XII are as described above under HEENT exam.  Motor exam: Normal bulk, strength and tone is noted. There is no obvious action or resting tremor.  No drift or rebound. Fine motor skills and coordination: Intact  finger taps, hand movements and rapid alternating patting in both upper extremities, intact foot taps bilaterally in the lower extremities.    Cerebellar testing: No dysmetria or intention tremor. There is no truncal or gait ataxia.  Normal finger-to-nose, normal heel-to-shin bilaterally. Reflexes 1+ throughout, toes are downgoing bilaterally. Sensory exam: intact to light touch in the upper and lower extremities.  Gait, station and balance: He stands easily. No veering to one side is noted. No leaning to one side is noted. Posture is age-appropriate and stance is narrow based. Gait shows normal stride length and normal pace. No problems turning are noted.  Romberg negative, tandem walk unremarkable.  Assessment and Plan:  In summary, Carsin Giannini is a very pleasant 37 y.o.-year old male with an underlying medical history of reflux disease, and obesity, who presents for eval ration of his recurrent bifrontal headaches of approximately 12 days duration with intermittent nature and description not classic for migraine headaches.  Neurological exam is nonfocal, he had a recent head CT without contrast and brain MRI with and without contrast without any alarming findings.  He is largely reassured and advised that his headaches could likely be from a combination of factors, these factors include morning headaches from possible underlying sleep disturbance such as sleep apnea, elevated blood pressure values, strain on the eyes, perpetuating headaches from medication overuse, suboptimal hydration.  He is advised to follow-up with his primary care to  monitor his blood pressure, he may benefit from keeping a 2-week log.  He is advised to make an appointment with his eye doctor to get a formal, dilated and updated eye exam done, he may benefit from updated eyeglasses.  He is advised to proceed with a home sleep test to evaluate for underlying obstructive sleep apnea as he may be at risk for OSA.  He has a crowded airway, has a history of snoring, has mild obesity although he does indicate having lost about 30 pounds in the past 8 months.  He is advised to stay well-hydrated and well rested and we will proceed with a home sleep test through our office and plan to follow-up afterwards.  He is advised not to use Excedrin Migraine or Excedrin daily.  He is advised to continue to use Tylenol as needed but with caution.  He is advised that we will plan a follow-up after sleep testing and consider AutoPap therapy if he has obstructive sleep apnea.  In the meantime, he is advised to make an appointment with his PCP and eye doctor for checkup.  His headaches improved after his most recent ER visit especially after getting steroids.  I do not think he should get another steroid Dosepak quite yet as he just had IV steroids last week.  We may consider symptomatic medication such as amitriptyline but for now I would like to pursue further workup, exclude any medical causes of his recurrent headaches.  I answered all his questions today and he was in agreement.  Randall Foley, MD, PhD

## 2022-05-18 NOTE — Patient Instructions (Addendum)
Your headache is likely due to a combination of factors and does not sound like a typical migraine.  Contributors to your headache could be blood pressure elevation, strain on the eyes and need for new eyeglasses, sleep apnea as you have a history of snoring and may be at risk for sleep apnea, he also woke up with headaches.  Overusing certain over-the-counter headache medication can also perpetuate headaches.  Please avoid taking Excedrin daily or nearly daily.  You can take Tylenol as needed for now, I would recommend that you make a follow-up appointment with your primary care to monitor blood pressure, I would recommend that you make an appointment with your eye doctor to get your eyes checked and see if you need updated eyeglasses, I would recommend a full, dilated eye exam.  I will order a home sleep test to evaluate you for sleep apnea as obstructive sleep apnea can contribute to morning headaches and recurrent headaches as well as blood pressure elevations.    If you have obstructive sleep apnea I will likely recommend a treatment machine called AutoPap, which is similar to his CPAP machine.   Please remember, common headache triggers are: sleep deprivation, dehydration, overheating, stress, hypoglycemia or skipping meals and blood sugar fluctuations, excessive pain medications or excessive alcohol use or caffeine withdrawal. Some people have food triggers such as aged cheese, orange juice or chocolate, especially dark chocolate, or MSG (monosodium glutamate). Try to avoid these headache triggers as much possible. It may be helpful to keep a headache diary to figure out what makes your headaches worse or brings them on and what alleviates them. Some people report headache onset after exercise but studies have shown that regular exercise may actually prevent headaches from coming. If you have exercise-induced headaches, please make sure that you drink plenty of fluid before and after exercising and that  you do not over do it and do not overheat.  We will plan a follow-up after your sleep test.  In the meantime, please make an appointment with your primary care and eye doctor.

## 2022-06-02 ENCOUNTER — Telehealth: Payer: Self-pay | Admitting: Neurology

## 2022-06-02 NOTE — Telephone Encounter (Signed)
HST- self pay   Patient is scheduled at [facility] for 06/24/22 at 10 AM  Mailed packet to the patient.

## 2023-12-10 ENCOUNTER — Ambulatory Visit
Admission: EM | Admit: 2023-12-10 | Discharge: 2023-12-10 | Disposition: A | Discharge status: Home / Self Care | Payer: Self-pay | Attending: Family Medicine | Admitting: Family Medicine

## 2023-12-10 DIAGNOSIS — R0789 Other chest pain: Secondary | ICD-10-CM

## 2023-12-10 MED ORDER — IBUPROFEN 600 MG PO TABS: 600.0000 mg | ORAL_TABLET | Freq: Four times a day (QID) | ORAL | 0 refills | Status: AC | PRN

## 2023-12-10 MED ORDER — FAMOTIDINE 20 MG PO TABS: 20.0000 mg | ORAL_TABLET | Freq: Two times a day (BID) | ORAL | 0 refills | Status: AC

## 2023-12-10 NOTE — ED Provider Notes (Signed)
 [name]  Note:  This document was prepared using Conservation officer, historic buildings and may include unintentional dictation errors.  MRN: [id number] DOB: [birth year]  Subjective:   Randall Rodriguez is a 38 y.o. male presenting for 3 day history of mid-left sided chest pain, felt awkward tingling of his left arm yesterday which worried the patient significantly.  No fever, cough, shortness of breath, wheezing, trauma, fall, nausea, vomiting, belly pain.  No history of hypertension.  No cardiac conditions, arrhythmias personal or family.  No drug use.  Has occasional alcohol drink 1-2 times a month.  No marijuana use.  Admits to poor eating habits.  Works as a museum/gallery curator.  No current facility-administered medications for this encounter.  Current Outpatient Medications:    Acetaminophen  (TYLENOL  PO), Take 1,000 mg by mouth as needed., Disp: , Rfl:    aspirin-acetaminophen -caffeine (EXCEDRIN MIGRAINE) 250-250-65 MG tablet, Take by mouth every 6 (six) hours as needed for headache., Disp: , Rfl:    metoCLOPramide  (REGLAN ) 10 MG tablet, Take 1 tablet (10 mg total) by mouth every 6 (six) hours., Disp: 30 tablet, Rfl: 0   valACYclovir  (VALTREX ) 500 MG tablet, Take 1 tablet (500 mg total) by mouth daily. (Patient not taking: Reported on 05/18/2022), Disp: 30 tablet, Rfl: 5   No Known Allergies  Past Medical History:  Diagnosis Date   Genital herpes    GERD (gastroesophageal reflux disease)    Marijuana smoker    Smoker      History reviewed. No pertinent surgical history.  Family History  Problem Relation Age of Onset   Hypertension Father    Diabetes Father    Diabetes Brother     Social History   Tobacco Use   Smoking status: Former    Types: Cigars   Smokeless tobacco: Never   Tobacco comments:    Around 51-22 years old smoked black and mild for about a year or two   Vaping Use   Vaping status: Never Used  Substance Use Topics   Alcohol use: Yes     Comment: occ   Drug use: Not Currently    Types: Marijuana   ROS   Objective:   Vitals: BP 135/85 (BP Location: Right Arm)   Pulse 82   Temp 98.6 F (37 C) (Oral)   Resp 16   SpO2 97%   Physical Exam Constitutional:      General: He is not in acute distress.    Appearance: Normal appearance. He is well-developed and normal weight. He is not ill-appearing, toxic-appearing or diaphoretic.  HENT:     Head: Normocephalic and atraumatic.     Right Ear: External ear normal.     Left Ear: External ear normal.     Nose: Nose normal.     Mouth/Throat:     Mouth: Mucous membranes are moist.  Eyes:     General: No scleral icterus.       Right eye: No discharge.        Left eye: No discharge.     Extraocular Movements: Extraocular movements intact.  Cardiovascular:     Rate and Rhythm: Normal rate and regular rhythm.     Heart sounds: Normal heart sounds. No murmur heard.    No friction rub. No gallop.  Pulmonary:     Effort: Pulmonary effort is normal. No respiratory distress.     Breath sounds: Normal breath sounds. No stridor. No wheezing, rhonchi or rales.  Chest:  Chest wall: Tenderness (mild over area outlined) present.    Musculoskeletal:     Cervical back: Normal range of motion.  Neurological:     Mental Status: He is alert and oriented to person, place, and time.  Psychiatric:        Mood and Affect: Mood normal.        Behavior: Behavior normal.        Thought Content: Thought content normal.        Judgment: Judgment normal.    ED ECG REPORT   Date: 12/10/2023  EKG Time: 11:15 AM  Rate: 86bpm  Rhythm: normal sinus rhythm  Axis: normal  Intervals:none  ST&T Change: non-specific T-wave flattening in III, aVL  Narrative Interpretation: Sinus rhythm at 86bpm with transient non-specific arrhythmia.  Assessment and Plan :   PDMP not reviewed this encounter.  1. Atypical chest pain    Low heart score, PERC score.  EKG largely remarkable with sinus  rhythm and nonspecific borderline sinus arrhythmia.  Recommended famotidine  for acid reflux, diet changes.  Ibuprofen  for musculoskeletal chest wall pain likely related to his work.  Follow-up with his PCP to discuss possibility of referral to cardiology.  Counseled patient on potential for adverse effects with medications prescribed/recommended today, ER and return-to-clinic precautions discussed, patient verbalized understanding.    Christopher Savannah, [place] 12/10/23 1126

## 2023-12-10 NOTE — ED Triage Notes (Signed)
 Pt c/o mid/left side chest discomfort x 3 days-pain to LUE started yesterday-taking ASA 81mg -also c/o dizziness x 3 weeks-taking OTC motion sickness med with some relief-NAD-steady gait

## 2023-12-28 ENCOUNTER — Encounter (HOSPITAL_COMMUNITY): Payer: Self-pay | Admitting: Emergency Medicine

## 2023-12-28 ENCOUNTER — Emergency Department (HOSPITAL_COMMUNITY)
Admission: EM | Admit: 2023-12-28 | Discharge: 2023-12-28 | Disposition: A | Discharge status: Home / Self Care | Payer: Self-pay

## 2023-12-28 ENCOUNTER — Other Ambulatory Visit: Payer: Self-pay

## 2023-12-28 ENCOUNTER — Emergency Department (HOSPITAL_COMMUNITY): Payer: Self-pay

## 2023-12-28 DIAGNOSIS — R0789 Other chest pain: Secondary | ICD-10-CM | POA: Insufficient documentation

## 2023-12-28 DIAGNOSIS — K219 Gastro-esophageal reflux disease without esophagitis: Secondary | ICD-10-CM | POA: Insufficient documentation

## 2023-12-28 LAB — CBC
HCT: 45 % (ref 39.0–52.0)
Hemoglobin: 15.7 g/dL (ref 13.0–17.0)
MCH: 29.4 pg (ref 26.0–34.0)
MCHC: 34.9 g/dL (ref 30.0–36.0)
MCV: 84.3 fL (ref 80.0–100.0)
Platelets: 312 K/uL (ref 150–400)
RBC: 5.34 MIL/uL (ref 4.22–5.81)
RDW: 12.2 % (ref 11.5–15.5)
WBC: 9.3 K/uL (ref 4.0–10.5)
nRBC: 0 % (ref 0.0–0.2)

## 2023-12-28 LAB — BASIC METABOLIC PANEL WITH GFR
Anion gap: 11 (ref 5–15)
BUN: 15 mg/dL (ref 6–20)
CO2: 24 mmol/L (ref 22–32)
Calcium: 9.9 mg/dL (ref 8.9–10.3)
Chloride: 103 mmol/L (ref 98–111)
Creatinine, Ser: 1.14 mg/dL (ref 0.61–1.24)
GFR, Estimated: >60 mL/min
Glucose, Bld: 91 mg/dL (ref 70–99)
Potassium: 4 mmol/L (ref 3.5–5.1)
Sodium: 138 mmol/L (ref 135–145)

## 2023-12-28 LAB — TROPONIN T, HIGH SENSITIVITY: Troponin T High Sensitivity: <15 ng/L (ref 0–19)

## 2023-12-28 MED ORDER — LIDOCAINE VISCOUS HCL 2 % MT SOLN
15.0000 mL | Freq: Once | OROMUCOSAL | Status: AC
  Administered 2023-12-28: 15 mL via ORAL
  Filled 2023-12-28: qty 15

## 2023-12-28 MED ORDER — ALUM & MAG HYDROXIDE-SIMETH 200-200-20 MG/5ML PO SUSP
30.0000 mL | Freq: Once | ORAL | Status: AC
  Administered 2023-12-28: 30 mL via ORAL
  Filled 2023-12-28: qty 30

## 2023-12-28 MED ORDER — SUCRALFATE 1 G PO TABS: 1.0000 g | ORAL_TABLET | Freq: Three times a day (TID) | ORAL | 0 refills | Status: AC

## 2023-12-28 MED ORDER — FAMOTIDINE 40 MG PO TABS: 40.0000 mg | ORAL_TABLET | Freq: Every day | ORAL | 0 refills | Status: AC

## 2023-12-28 NOTE — Discharge Instructions (Signed)
 Take your Pepcid  Carafate  as prescribed.  You can use Tylenol  Motrin  as needed for pain.  Call your primary care doctor's office and make a follow-up appoint for the next 1 to 2 weeks.  Return to the ER for new or worsening symptoms.

## 2023-12-28 NOTE — ED Provider Notes (Signed)
 " [facility] EMERGENCY DEPARTMENT AT [facility] Provider Note   CSN: [id number] Arrival date & time: 12/28/23  1500     Patient presents with: Chest Pain   Randall Rodriguez is a 38 y.o. male.   37 year old male presents for evaluation of chest pain.  States has been going on off and on for 3 weeks.  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  States he is at urgent care few weeks ago for similar symptoms they diagnosed him with GERD and started him on Pepcid .  He states this helped but he had run out.  He denies any shortness of breath, states he does have some occasional left posterior arm pain though.  Denies any nausea, vomiting, headache, or any other symptoms or concerns at this time.   Chest Pain Associated symptoms: no abdominal pain, no back pain, no cough, no fever, no palpitations, no shortness of breath and no vomiting        Prior to Admission medications  Medication Sig Start Date End Date Taking? Authorizing Provider  famotidine  (PEPCID ) 40 MG tablet Take 1 tablet (40 mg total) by mouth daily. 12/28/23  Yes Jenavi Beedle L, DO  sucralfate  (CARAFATE ) 1 g tablet Take 1 tablet (1 g total) by mouth 4 (four) times daily -  with meals and at bedtime. 12/28/23 01/11/24 Yes Deitrich Steve L, DO  Acetaminophen  (TYLENOL  PO) Take 1,000 mg by mouth as needed.    [provider]  aspirin-acetaminophen -caffeine (EXCEDRIN MIGRAINE) 250-250-65 MG tablet Take by mouth every 6 (six) hours as needed for headache.    [provider]  famotidine  (PEPCID ) 20 MG tablet Take 1 tablet (20 mg total) by mouth 2 (two) times daily. 12/10/23   Christopher Savannah, PA-C  ibuprofen  (ADVIL ) 600 MG tablet Take 1 tablet (600 mg total) by mouth every 6 (six) hours as needed. 12/10/23   Christopher Savannah, PA-C  metoCLOPramide  (REGLAN ) 10 MG tablet Take 1 tablet (10 mg total) by mouth every 6 (six) hours. 05/14/22   Lowther, Amy, DO  valACYclovir  (VALTREX ) 500 MG tablet  Take 1 tablet (500 mg total) by mouth daily. Patient not taking: Reported on 05/18/2022 02/05/16   Christopher Savannah, PA-C    Allergies: Patient has no known allergies.    Review of Systems  Constitutional:  Negative for chills and fever.  HENT:  Negative for ear pain and sore throat.   Eyes:  Negative for pain and visual disturbance.  Respiratory:  Negative for cough and shortness of breath.   Cardiovascular:  Positive for chest pain. Negative for palpitations.  Gastrointestinal:  Negative for abdominal pain and vomiting.  Genitourinary:  Negative for dysuria and hematuria.  Musculoskeletal:  Negative for arthralgias and back pain.  Skin:  Negative for color change and rash.  Neurological:  Negative for seizures and syncope.  All other systems reviewed and are negative.   Updated Vital Signs BP 133/80 (BP Location: Left Arm)   Pulse (!) 102   Temp 98.8 F (37.1 C) (Oral)   Resp 16   SpO2 100%   Physical Exam Vitals and nursing note reviewed.  Constitutional:      General: He is not in acute distress.    Appearance: He is well-developed.  HENT:     Head: Normocephalic and atraumatic.  Eyes:     Conjunctiva/sclera: Conjunctivae normal.  Cardiovascular:     Rate and Rhythm: Normal rate and regular rhythm.  Heart sounds: No murmur heard. Pulmonary:     Effort: Pulmonary effort is normal. No respiratory distress.     Breath sounds: Normal breath sounds.  Abdominal:     Palpations: Abdomen is soft.     Tenderness: There is no abdominal tenderness.  Musculoskeletal:        General: No swelling.     Cervical back: Neck supple.  Skin:    General: Skin is warm and dry.     Capillary Refill: Capillary refill takes less than 2 seconds.  Neurological:     General: No focal deficit present.     Mental Status: He is alert.  Psychiatric:        Mood and Affect: Mood normal.     (all labs ordered are listed, but only abnormal results are displayed) Labs Reviewed  BASIC  METABOLIC PANEL WITH GFR  CBC  TROPONIN T, HIGH SENSITIVITY  TROPONIN T, HIGH SENSITIVITY    EKG: EKG Interpretation Date/Time:  Tuesday December 28 2023 15:08:51 EST Ventricular Rate:  91 PR Interval:  159 QRS Duration:  93 QT Interval:  344 QTC Calculation: 424 R Axis:   71  Text Interpretation: Sinus rhythm Baseline wander in lead(s) I III aVL Compared with prior EKG from 12/10/2023 Confirmed by Gennaro Bouchard ([id number]) on 12/28/2023 3:38:59 PM  Radiology: No results found.   Procedures   Medications Ordered in the ED  alum & mag hydroxide-simeth (MAALOX/MYLANTA) 200-200-20 MG/5ML suspension 30 mL (30 mLs Oral Given 12/28/23 1629)    And  lidocaine  (XYLOCAINE ) 2 % viscous mouth solution 15 mL (15 mLs Oral Given 12/28/23 1629)                                    Medical Decision Making Patient here for chest pain.  He does have some reproducible chest pain in the left shoulder area on exam, he was taking Pepcid  which did seem to help his pain as well.  I gave him a GI cocktail here.  Will plan to refill Pepcid  and Carafate  he is advised Tylenol  Motrin  as needed for pain.  Patient is pending lab work and x-ray.  Patient signed out to oncoming provider pending remainder of workup and will be to discharge patient home  Problems Addressed: Atypical chest pain: acute illness or injury Gastroesophageal reflux disease without esophagitis: chronic illness or injury  Amount and/or Complexity of Data Reviewed External Data Reviewed: notes.    Details: Prior ED records reviewed patient seen 05-14-2022 for migraine Labs: ordered. Decision-making details documented in ED Course.    Details: Ordered and pending Radiology: ordered and independent interpretation performed. Decision-making details documented in ED Course.    Details: Ordered and interpreted by me independently of radiology Chest x-ray: Shows no acute abnormality ECG/medicine tests: ordered and independent interpretation  performed. Decision-making details documented in ED Course.    Details: Ordered and interpreted by me in the absence of cardiology and shows sinus rhythm, no STEMI, or significant change when compared to prior EKG  Risk OTC drugs. Prescription drug management.     Final diagnoses:  Gastroesophageal reflux disease without esophagitis  Atypical chest pain    ED Discharge Orders          Ordered    famotidine  (PEPCID ) 40 MG tablet  Daily        12/28/23 1649    sucralfate  (CARAFATE ) 1 g tablet  3 times daily  with meals & bedtime        12/28/23 1649               Gennaro Duwaine CROME, DO 12/28/23 1650  "

## 2023-12-28 NOTE — ED Triage Notes (Signed)
 Pt reports L sided chest pain x 2 weeks. Also reports radiation to left arm. C/o dizziness with pain. Denies cardiac hx.

## 2023-12-28 NOTE — ED Provider Notes (Signed)
 Patient care was taken over from Dr. Gennaro.  Patient presented with some chest pain.  He said initially had improved with Pepcid  but he ran out.  No other symptoms.  No shortness of breath.  He had some mild tachycardia on arrival with a heart rate of 102 but this resolved.  His heart rate on my exam was 88-90.  He does not have hypoxia or other symptoms that would be more concerning for PE.  His chest x-ray did not show any evidence of pneumonia or pneumothorax.  His troponin is negative.  Given the amount of time he is having pain, repeat troponin was not done.  He is currently asymptomatic.  Discharge instructions were given per Dr. Gennaro.  She also had sent a prescription for refill on his Pepcid .  He was encouraged to have close follow-up with his PCP.  Return precautions were given.   Lenor Hollering, MD 12/28/23 [place]
